# Patient Record
Sex: Female | Born: 1965
Health system: Southern US, Community
[De-identification: ages and names within clinical notes are randomized; demographics above are authoritative.]

## PROBLEM LIST (undated history)

## (undated) DIAGNOSIS — E559 Vitamin D deficiency, unspecified: Secondary | ICD-10-CM

## (undated) DIAGNOSIS — F32A Depression, unspecified: Secondary | ICD-10-CM

## (undated) DIAGNOSIS — F419 Anxiety disorder, unspecified: Secondary | ICD-10-CM

## (undated) DIAGNOSIS — D259 Leiomyoma of uterus, unspecified: Secondary | ICD-10-CM

## (undated) DIAGNOSIS — F329 Major depressive disorder, single episode, unspecified: Secondary | ICD-10-CM

## (undated) HISTORY — PX: UTERINE FIBROID SURGERY: SHX826

## (undated) HISTORY — PX: ORTHOPEDIC SURGERY: SHX850

## (undated) HISTORY — DX: Major depressive disorder, single episode, unspecified: F32.9

## (undated) HISTORY — DX: Vitamin D deficiency, unspecified: E55.9

## (undated) HISTORY — DX: Leiomyoma of uterus, unspecified: D25.9

## (undated) HISTORY — DX: Depression, unspecified: F32.A

## (undated) HISTORY — DX: Anxiety disorder, unspecified: F41.9

---

## 2001-02-11 ENCOUNTER — Other Ambulatory Visit: Admission: RE | Admit: 2001-02-11 | Discharge: 2001-02-11 | Payer: Self-pay | Admitting: Family Medicine

## 2003-10-31 ENCOUNTER — Ambulatory Visit (HOSPITAL_COMMUNITY): Admission: RE | Admit: 2003-10-31 | Discharge: 2003-11-01 | Payer: Self-pay | Admitting: Oral & Maxillofacial Surgery

## 2004-10-17 ENCOUNTER — Ambulatory Visit: Payer: Self-pay

## 2006-01-21 ENCOUNTER — Ambulatory Visit: Payer: Self-pay

## 2006-03-03 ENCOUNTER — Ambulatory Visit: Payer: Self-pay

## 2006-05-13 ENCOUNTER — Ambulatory Visit: Payer: Self-pay | Admitting: Obstetrics & Gynecology

## 2007-04-20 ENCOUNTER — Ambulatory Visit: Payer: Self-pay | Admitting: Family Medicine

## 2008-04-24 ENCOUNTER — Ambulatory Visit: Payer: Self-pay | Admitting: Family Medicine

## 2008-06-01 HISTORY — PX: BUNIONECTOMY: SHX129

## 2009-06-03 ENCOUNTER — Ambulatory Visit: Payer: Self-pay | Admitting: Obstetrics and Gynecology

## 2009-06-06 ENCOUNTER — Ambulatory Visit: Payer: Self-pay | Admitting: Obstetrics and Gynecology

## 2009-06-17 ENCOUNTER — Ambulatory Visit: Payer: Self-pay

## 2010-06-19 ENCOUNTER — Ambulatory Visit: Payer: Self-pay

## 2011-09-16 ENCOUNTER — Ambulatory Visit: Payer: Self-pay | Admitting: Family Medicine

## 2012-04-22 ENCOUNTER — Ambulatory Visit: Payer: Self-pay | Admitting: Family Medicine

## 2012-05-20 ENCOUNTER — Ambulatory Visit: Payer: Self-pay | Admitting: Family Medicine

## 2013-04-04 ENCOUNTER — Ambulatory Visit: Payer: Self-pay | Admitting: Family Medicine

## 2013-04-11 ENCOUNTER — Ambulatory Visit: Payer: Self-pay | Admitting: Family Medicine

## 2013-12-21 IMAGING — CR CERVICAL SPINE - COMPLETE 4+ VIEW
1 series · 7 of 7 positions shown · non-contrast
Comparison: None.

CLINICAL DATA: Neck pain.

EXAM:
CERVICAL SPINE  4+ VIEWS

[Series 1: lat · 0.17mm/px · 7 of 7 slices shown]
[im 1/7]
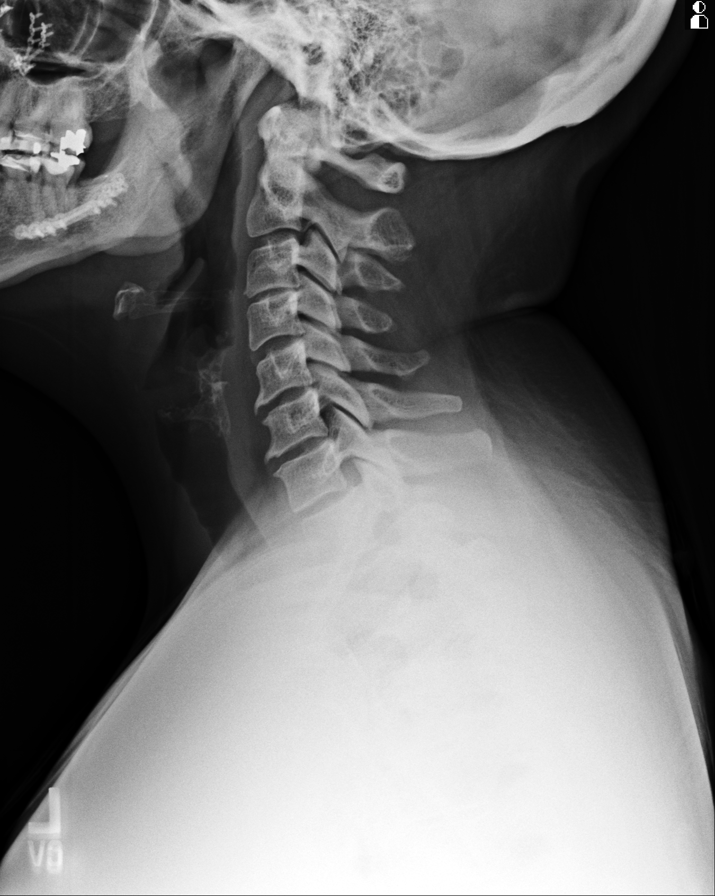
[im 2/7]
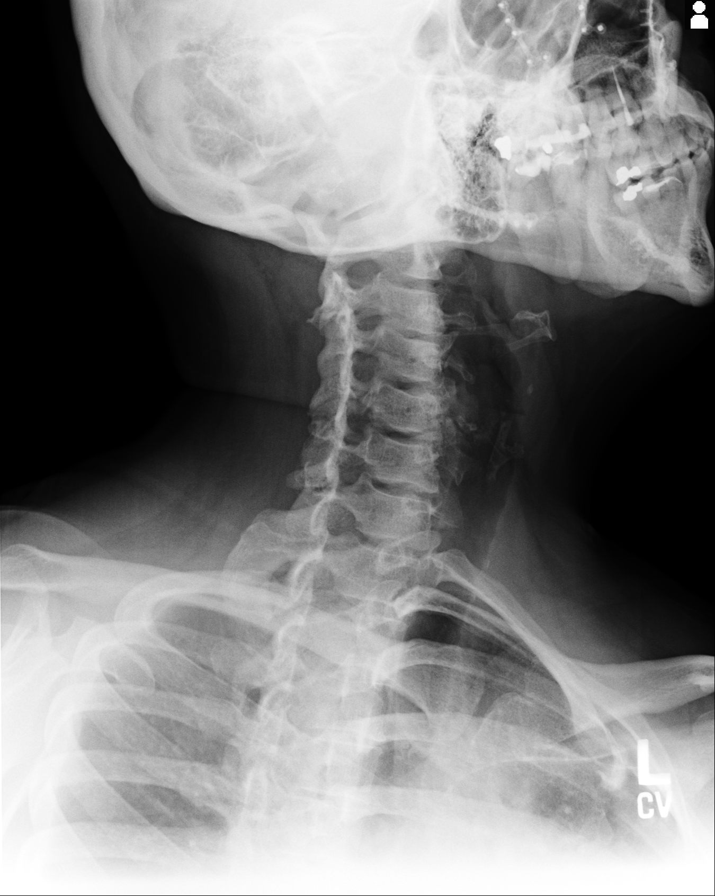
[im 3/7]
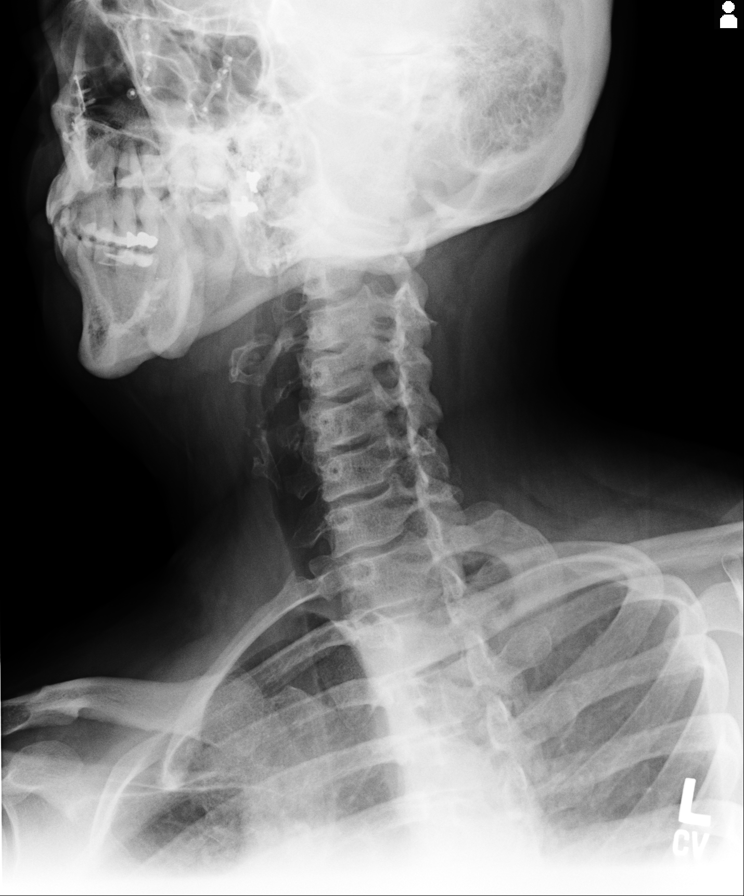
[im 4/7]
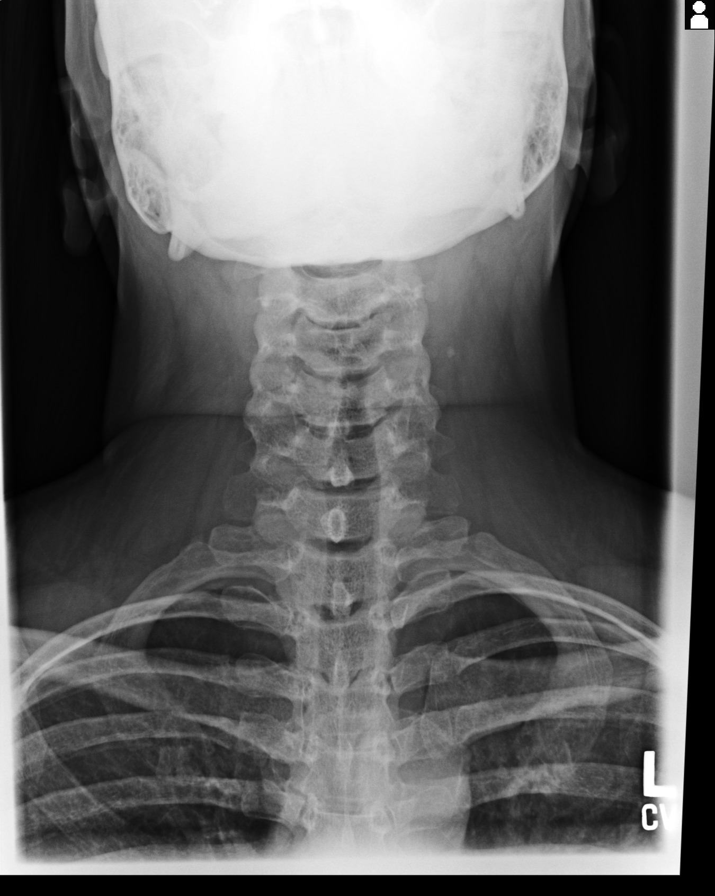
[im 5/7]
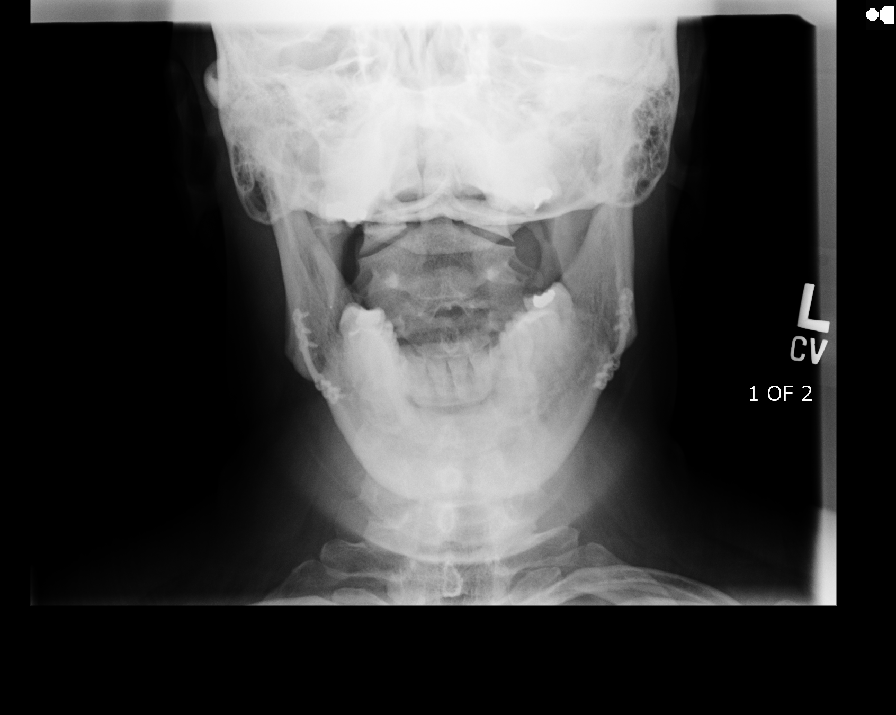
[im 6/7]
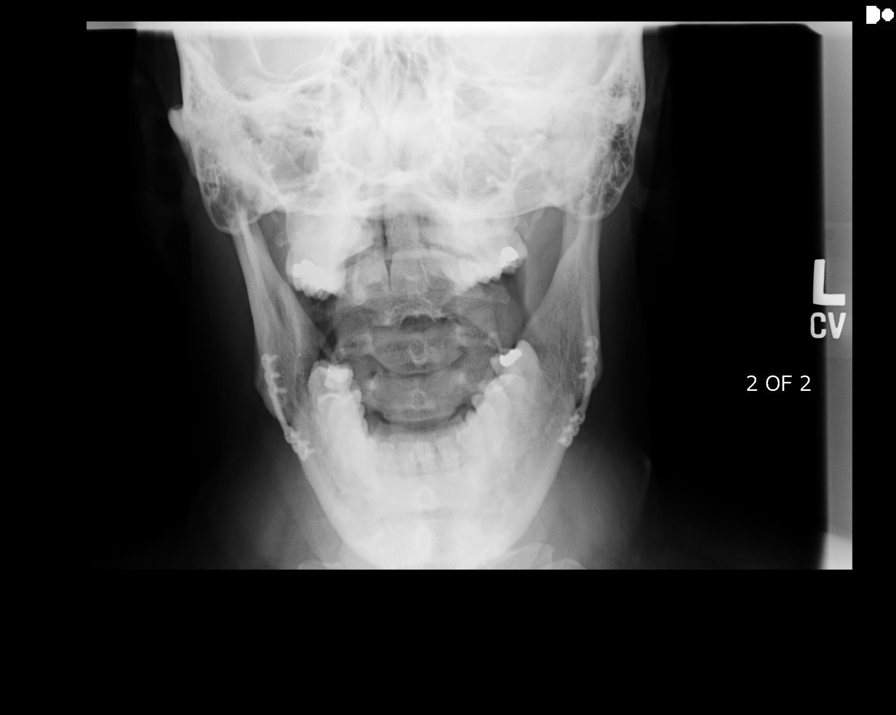
[im 7/7]
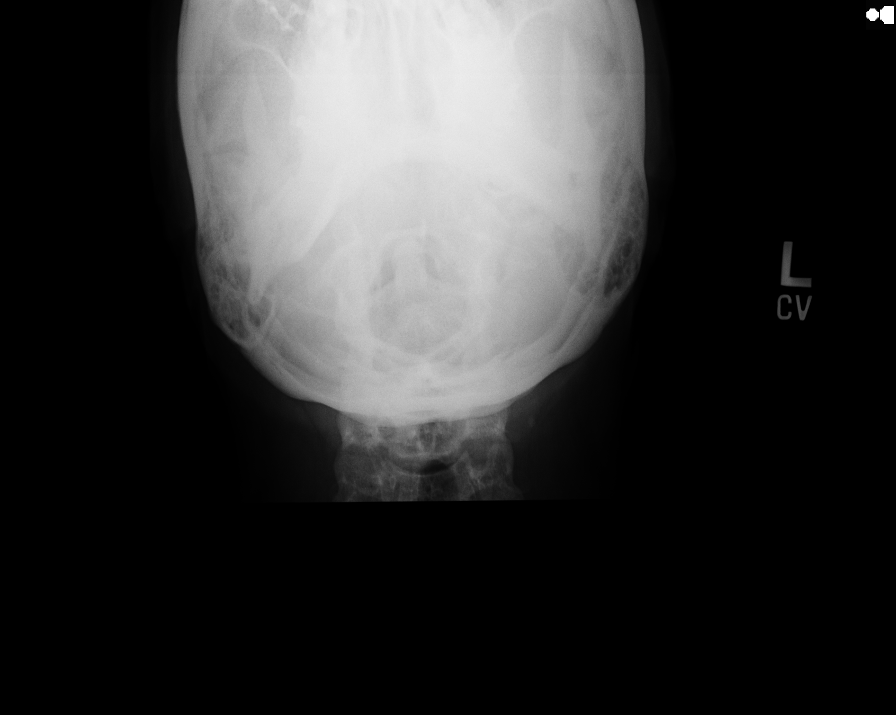

[7 of 7 positions shown; findings below may reference images not displayed]

FINDINGS: Moderate multilevel cervical spondylosis is present. C3-C4, C5-C6
and C6-C7 disc space narrowing is present with marginal osteophytes.
Prevertebral soft tissues are normal. Cervical spinal alignment is
anatomic. The craniocervical alignment appears normal. Mild C5-C6
bilateral uncovertebral spurring. Mild left C6-C7 scars that faint
carotid atherosclerosis calcifications are present. The odontoid
appears intact. Mandibular fixation hardware incidentally noted.
IMPRESSION: Moderate cervical spondylosis detailed above. No acute abnormality.

## 2015-05-14 LAB — VITAMIN D 25 HYDROXY (VIT D DEFICIENCY, FRACTURES): VIT D 25 HYDROXY: 29.8

## 2015-05-15 LAB — THYROXINE (T4) FREE, DIRECT
Thyroxine (T4): 1.26
Vit D, 1,25-Dihydroxy: 43.3

## 2015-08-26 ENCOUNTER — Ambulatory Visit (INDEPENDENT_AMBULATORY_CARE_PROVIDER_SITE_OTHER): Payer: BLUE CROSS/BLUE SHIELD | Admitting: Physician Assistant

## 2015-08-26 ENCOUNTER — Encounter: Payer: Self-pay | Admitting: Physician Assistant

## 2015-08-26 VITALS — BP 118/70 | HR 90 | Ht 68.0 in | Wt 164.0 lb

## 2015-08-26 DIAGNOSIS — Z131 Encounter for screening for diabetes mellitus: Secondary | ICD-10-CM

## 2015-08-26 DIAGNOSIS — J358 Other chronic diseases of tonsils and adenoids: Secondary | ICD-10-CM

## 2015-08-26 DIAGNOSIS — Z8619 Personal history of other infectious and parasitic diseases: Secondary | ICD-10-CM

## 2015-08-26 DIAGNOSIS — Z13220 Encounter for screening for lipoid disorders: Secondary | ICD-10-CM | POA: Diagnosis not present

## 2015-08-26 DIAGNOSIS — E559 Vitamin D deficiency, unspecified: Secondary | ICD-10-CM

## 2015-08-26 DIAGNOSIS — Z1231 Encounter for screening mammogram for malignant neoplasm of breast: Secondary | ICD-10-CM

## 2015-08-26 DIAGNOSIS — R635 Abnormal weight gain: Secondary | ICD-10-CM

## 2015-08-26 LAB — COMPLETE METABOLIC PANEL WITH GFR
ALBUMIN: 4.2 g/dL (ref 3.6–5.1)
ALK PHOS: 43 U/L (ref 33–115)
ALT: 12 U/L (ref 6–29)
AST: 16 U/L (ref 10–35)
BILIRUBIN TOTAL: 1 mg/dL (ref 0.2–1.2)
BUN: 14 mg/dL (ref 7–25)
CALCIUM: 9.3 mg/dL (ref 8.6–10.2)
CO2: 26 mmol/L (ref 20–31)
Chloride: 104 mmol/L (ref 98–110)
Creat: 0.83 mg/dL (ref 0.50–1.10)
GFR, Est Non African American: 84 mL/min (ref >=60)
GLUCOSE: 76 mg/dL (ref 65–99)
POTASSIUM: 4 mmol/L (ref 3.5–5.3)
Sodium: 140 mmol/L (ref 135–146)
Total Protein: 6.8 g/dL (ref 6.1–8.1)

## 2015-08-26 LAB — LIPID PANEL
CHOL/HDL RATIO: 3 ratio (ref <=5.0)
CHOLESTEROL: 216 mg/dL — AB (ref 125–200)
HDL: 72 mg/dL (ref >=46)
LDL Cholesterol: 125 mg/dL (ref <130)
TRIGLYCERIDES: 93 mg/dL (ref <150)
VLDL: 19 mg/dL (ref <30)

## 2015-08-26 MED ORDER — PHENTERMINE HCL 37.5 MG PO TABS: 37.5000 mg | ORAL_TABLET | Freq: Every day | ORAL | Status: DC

## 2015-08-26 NOTE — Progress Notes (Signed)
   Subjective:    Patient ID: Jill Webb, female    DOB: 02-02-1966, 50 y.o.   MRN: [id number]  HPI Pt is a 50 yo female who presents to the clinic to establish care.   .. Active Ambulatory Problems    Diagnosis Date Noted  . History of Helicobacter pylori infection 08/26/2015  . Vitamin D insufficiency 08/26/2015   Resolved Ambulatory Problems    Diagnosis Date Noted  . No Resolved Ambulatory Problems   No Additional Past Medical History   .[name] Family History  Problem Relation Age of Onset  . Hypothyroidism Mother   . Cancer Sister     breast   .. Social History   Social History  . Marital Status: Married    Spouse Name: N/A  . Number of Children: N/A  . Years of Education: N/A   Occupational History  . Not on file.   Social History Main Topics  . Smoking status: Never Smoker   . Smokeless tobacco: Not on file  . Alcohol Use: 0.0 oz/week    0 Standard drinks or equivalent per week  . Drug Use: No  . Sexual Activity: Not Currently   Other Topics Concern  . Not on file   Social History Narrative  . No narrative on file   Pt brings in last bloodwork with vitamin D, Thyroid, b12. Vitamin  D a little low at 29. She started 5000 units since then.   She is actively trying to lose weight and has reached a stall in weight loss. Goal is 15-20lbs. She is walking 4-5 times a week. She is not counting calories. She has never tried any medications.   She does mention some growths on tonsils. They have been present on both sides and come up, last for a while and then seem to go away. She has one on her right tonsils since January. Seems to be getting a little smaller. Does sometime create some tickle sensation in throat. No pain.    Review of Systems  All other systems reviewed and are negative.      Objective:   Physical Exam  Constitutional: She is oriented to person, place, and time. She appears well-developed and well-nourished.  HENT:  Head:  Normocephalic and atraumatic.  Mouth/Throat:    Neck: Normal range of motion. Neck supple. No thyromegaly present.  Cardiovascular: Normal rate, regular rhythm and normal heart sounds.   Pulmonary/Chest: Effort normal and breath sounds normal.  Lymphadenopathy:    She has no cervical adenopathy.  Neurological: She is alert and oriented to person, place, and time.  Psychiatric: She has a normal mood and affect. Her behavior is normal.          Assessment & Plan:  Vitamin d insuffiency- almost at goal in December. Start vitamin d OTC 5000 units. Continue this. Discussed to take with protein to help absorb better. Also spring and summer will absorb more D.   Abnormal weight gain- will start phentermine to give her a boost with weight loss. Goal is 15-20lbs. She is walking 4-5 times a week. Discussed side effects. Follow up weight check in 1 month.   Tonsil abnormality- discussed with patient appears normal and the fact that comes and goes appears to be a normal variant. Discussed ENT referral if becomes bothersome. Consider salt water gargles.   Needs CPE with PAP. Ordered mammogram. Fasting labs ordered.  Will scan in labs done in December.

## 2015-08-26 NOTE — Patient Instructions (Signed)
[email or web address]

## 2015-08-30 ENCOUNTER — Ambulatory Visit (INDEPENDENT_AMBULATORY_CARE_PROVIDER_SITE_OTHER): Payer: BLUE CROSS/BLUE SHIELD

## 2015-08-30 DIAGNOSIS — Z1231 Encounter for screening mammogram for malignant neoplasm of breast: Secondary | ICD-10-CM | POA: Diagnosis not present

## 2015-09-04 ENCOUNTER — Encounter: Payer: Self-pay | Admitting: Physician Assistant

## 2015-09-06 ENCOUNTER — Encounter: Payer: BLUE CROSS/BLUE SHIELD | Admitting: Physician Assistant

## 2015-09-06 ENCOUNTER — Ambulatory Visit: Payer: BLUE CROSS/BLUE SHIELD

## 2015-09-10 ENCOUNTER — Other Ambulatory Visit (HOSPITAL_COMMUNITY)
Admission: RE | Admit: 2015-09-10 | Discharge: 2015-09-10 | Disposition: A | Discharge status: Home / Self Care | Payer: BLUE CROSS/BLUE SHIELD | Source: Ambulatory Visit | Attending: Physician Assistant | Admitting: Physician Assistant

## 2015-09-10 ENCOUNTER — Ambulatory Visit (INDEPENDENT_AMBULATORY_CARE_PROVIDER_SITE_OTHER): Payer: BLUE CROSS/BLUE SHIELD | Admitting: Physician Assistant

## 2015-09-10 ENCOUNTER — Encounter: Payer: Self-pay | Admitting: Physician Assistant

## 2015-09-10 VITALS — BP 102/65 | HR 98 | Ht 68.0 in | Wt 166.0 lb

## 2015-09-10 DIAGNOSIS — R21 Rash and other nonspecific skin eruption: Secondary | ICD-10-CM

## 2015-09-10 DIAGNOSIS — Z0000 Encounter for general adult medical examination without abnormal findings: Secondary | ICD-10-CM | POA: Diagnosis not present

## 2015-09-10 DIAGNOSIS — Z01419 Encounter for gynecological examination (general) (routine) without abnormal findings: Secondary | ICD-10-CM | POA: Diagnosis not present

## 2015-09-10 DIAGNOSIS — Z1151 Encounter for screening for human papillomavirus (HPV): Secondary | ICD-10-CM | POA: Insufficient documentation

## 2015-09-10 MED ORDER — TRIAMCINOLONE ACETONIDE 0.1 % EX CREA: 1.0000 "application " | TOPICAL_CREAM | Freq: Two times a day (BID) | CUTANEOUS | Status: DC

## 2015-09-10 NOTE — Patient Instructions (Signed)

## 2015-09-10 NOTE — Progress Notes (Signed)
Subjective:    Patient ID: Jill Webb, female    DOB: [birth year], 50 y.o.   MRN: [id number]  HPI    Review of Systems     Objective:   Physical Exam        Assessment & Plan:   Subjective:     Jill Webb is a 50 y.o. female and is here for a comprehensive physical exam. The patient reports problems - she reports some itchy bumps on abdomen. seem to be drying up. denies any changes in detergent or medications. started before phentermine. .  Social History   Social History  . Marital Status: Married    Spouse Name: N/A  . Number of Children: N/A  . Years of Education: N/A   Occupational History  . Not on file.   Social History Main Topics  . Smoking status: Never Smoker   . Smokeless tobacco: Not on file  . Alcohol Use: 0.0 oz/week    0 Standard drinks or equivalent per week  . Drug Use: No  . Sexual Activity: Not Currently   Other Topics Concern  . Not on file   Social History Narrative   Health Maintenance  Topic Date Due  . PAP SMEAR  05/01/1989  . HIV Screening  09/09/2016 (Originally 05/02/1983)  . INFLUENZA VACCINE  12/31/2015  . TETANUS/TDAP  06/01/2020    The following portions of the patient's history were reviewed and updated as appropriate: allergies, current medications, past family history, past medical history, past social history, past surgical history and problem list.  Review of Systems Pertinent items noted in HPI and remainder of comprehensive ROS otherwise negative.   Objective:    BP 102/65 mmHg  Pulse 98  Ht 5\' 8"  (1.727 m)  Wt 166 lb (75.297 kg)  BMI 25.25 kg/m2  LMP 09/05/2015 General appearance: alert, cooperative and appears stated age Head: Normocephalic, without obvious abnormality, atraumatic Eyes: conjunctivae/corneas clear. PERRL, EOM's intact. Fundi benign. Ears: normal TM's and external ear canals both ears Nose: Nares normal. Septum midline. Mucosa normal. No drainage or sinus tenderness. Throat:  lips, mucosa, and tongue normal; teeth and gums normal Neck: no adenopathy, no carotid bruit, no JVD, supple, symmetrical, trachea midline and thyroid not enlarged, symmetric, no tenderness/mass/nodules Back: symmetric, no curvature. ROM normal. No CVA tenderness. Lungs: clear to auscultation bilaterally Heart: regular rate and rhythm, S1, S2 normal, no murmur, click, rub or gallop Abdomen: soft, non-tender; bowel sounds normal; no masses,  no organomegaly Pelvic: external genitalia normal, no adnexal masses or tenderness, no cervical motion tenderness, uterus normal size, shape, and consistency, vagina normal without discharge and some 10-12 oclock ulceration and friable mucosa. Extremities: extremities normal, atraumatic, no cyanosis or edema Pulses: 2+ and symmetric Skin: Skin color, texture, turgor normal. No rashes or lesions or a few scattered macular hyperpigmented spots on abdomen. no scales or vesciles.  Lymph nodes: Cervical, supraclavicular, and axillary nodes normal. Neurologic: Reflexes: 2+ and symmetric patallar reflexes absent    Assessment:    Healthy female exam.     Plan:    CPE- pt declined STD testing. Mammogram normal and up to date. Pap done today. Tdap done within 5 years per pt report. Labs looked great. Encouraged pt to start vitamin D 800 units and calcium 1500mg .   Abnormal weight gain- gained a few lbs. Continue on phentermine and diet and exercise. Follow up in 1 month for weight check.   Rash- unclear etiology. Seems to be allergic or possible flea  origin with 3 dogs. Triamcinolone cream given to use as needed for bug bits and itching. Call if worsening or not improving.  See After Visit Summary for Counseling Recommendations

## 2015-09-11 LAB — CYTOLOGY - PAP

## 2016-03-19 DIAGNOSIS — F4322 Adjustment disorder with anxiety: Secondary | ICD-10-CM | POA: Diagnosis not present

## 2016-03-23 DIAGNOSIS — F4322 Adjustment disorder with anxiety: Secondary | ICD-10-CM | POA: Diagnosis not present

## 2016-03-30 DIAGNOSIS — F4322 Adjustment disorder with anxiety: Secondary | ICD-10-CM | POA: Diagnosis not present

## 2016-04-06 DIAGNOSIS — F4322 Adjustment disorder with anxiety: Secondary | ICD-10-CM | POA: Diagnosis not present

## 2016-04-22 DIAGNOSIS — F4322 Adjustment disorder with anxiety: Secondary | ICD-10-CM | POA: Diagnosis not present

## 2016-05-04 DIAGNOSIS — F4322 Adjustment disorder with anxiety: Secondary | ICD-10-CM | POA: Diagnosis not present

## 2016-05-08 ENCOUNTER — Ambulatory Visit (INDEPENDENT_AMBULATORY_CARE_PROVIDER_SITE_OTHER): Payer: BLUE CROSS/BLUE SHIELD | Admitting: Physician Assistant

## 2016-05-08 ENCOUNTER — Encounter: Payer: Self-pay | Admitting: Physician Assistant

## 2016-05-08 VITALS — BP 109/75 | HR 79 | Ht 68.0 in | Wt 180.0 lb

## 2016-05-08 DIAGNOSIS — L659 Nonscarring hair loss, unspecified: Secondary | ICD-10-CM | POA: Diagnosis not present

## 2016-05-08 DIAGNOSIS — Z23 Encounter for immunization: Secondary | ICD-10-CM

## 2016-05-08 DIAGNOSIS — R5383 Other fatigue: Secondary | ICD-10-CM | POA: Diagnosis not present

## 2016-05-08 DIAGNOSIS — F4323 Adjustment disorder with mixed anxiety and depressed mood: Secondary | ICD-10-CM | POA: Diagnosis not present

## 2016-05-08 LAB — CBC WITH DIFFERENTIAL/PLATELET
Basophils Absolute: 63 cells/uL (ref 0–200)
Basophils Relative: 1 %
EOS PCT: 1 %
Eosinophils Absolute: 63 cells/uL (ref 15–500)
HCT: 43.4 % (ref 35.0–45.0)
Hemoglobin: 14 g/dL (ref 11.7–15.5)
LYMPHS PCT: 29 %
Lymphs Abs: 1827 cells/uL (ref 850–3900)
MCH: 26.6 pg — ABNORMAL LOW (ref 27.0–33.0)
MCHC: 32.3 g/dL (ref 32.0–36.0)
MCV: 82.4 fL (ref 80.0–100.0)
MPV: 10.6 fL (ref 7.5–12.5)
Monocytes Absolute: 378 cells/uL (ref 200–950)
Monocytes Relative: 6 %
NEUTROS PCT: 63 %
Neutro Abs: 3969 cells/uL (ref 1500–7800)
PLATELETS: 257 10*3/uL (ref 140–400)
RBC: 5.27 MIL/uL — AB (ref 3.80–5.10)
RDW: 15.4 % — AB (ref 11.0–15.0)
WBC: 6.3 10*3/uL (ref 3.8–10.8)

## 2016-05-08 LAB — COMPLETE METABOLIC PANEL WITH GFR
ALT: 9 U/L (ref 6–29)
AST: 13 U/L (ref 10–35)
Albumin: 4.7 g/dL (ref 3.6–5.1)
Alkaline Phosphatase: 45 U/L (ref 33–115)
BUN: 16 mg/dL (ref 7–25)
CHLORIDE: 105 mmol/L (ref 98–110)
CO2: 22 mmol/L (ref 20–31)
Calcium: 9.8 mg/dL (ref 8.6–10.2)
Creat: 0.96 mg/dL (ref 0.50–1.10)
GFR, Est African American: 81 mL/min (ref >=60)
GFR, Est Non African American: 70 mL/min (ref >=60)
GLUCOSE: 86 mg/dL (ref 65–99)
POTASSIUM: 4.4 mmol/L (ref 3.5–5.3)
SODIUM: 143 mmol/L (ref 135–146)
Total Bilirubin: 1.1 mg/dL (ref 0.2–1.2)
Total Protein: 7.6 g/dL (ref 6.1–8.1)

## 2016-05-08 LAB — IRON AND TIBC
%SAT: 32 % (ref 11–50)
IRON: 113 ug/dL (ref 40–190)
TIBC: 348 ug/dL (ref 250–450)
UIBC: 235 ug/dL (ref 125–400)

## 2016-05-08 LAB — FERRITIN: Ferritin: 68 ng/mL (ref 10–232)

## 2016-05-08 LAB — TSH: TSH: 0.87 mIU/L

## 2016-05-08 MED ORDER — ESCITALOPRAM OXALATE 5 MG PO TABS: 5.0000 mg | ORAL_TABLET | Freq: Every day | ORAL | 1 refills | Status: DC

## 2016-05-08 MED ORDER — CLOBETASOL PROPIONATE 0.05 % EX OINT: 1.0000 "application " | TOPICAL_OINTMENT | Freq: Two times a day (BID) | CUTANEOUS | 0 refills | Status: DC

## 2016-05-08 NOTE — Progress Notes (Addendum)
Subjective:    Patient ID: Jill Webb, female    DOB: May 22, 1966, 50 y.o.   MRN: [id number]  HPI Patient is a 50 yo female coming to the clinic today for hair loss started in October of this year. She did not think much of it until her hair dresser noticed a patch on the top right side of scalp it when she had an appointment with her a month ago. Patient is also reporting feeling depressed and anxious over the past few months. Patient reports that she has been diagnosed with seasonal depression in the past; however, since October she has been feeling more depressed. Patient has not taken an antidepressant in years due to patient trying to control symptoms on her own. Patient reports having a difficult time staying asleep but it has gotten better. She reports that one to two nights a week she wakes up during the night. She feels fatigued and has low energy. She also reports having a poor appetite. Some days she only eats breakfast but nothing else. Patient denies trouble concentrating. Patient denies suicidal or homicidal ideation.   Patient also reports cramping in hands and legs past two weeks. She denies numbness or tingling.   Patient also voiced that hemochromatosis runs in her family. She would like to be screened for this condition.    No past medical history on file.  Outpatient Encounter Prescriptions as of 05/08/2016  Medication Sig  . AMBULATORY NON FORMULARY MEDICATION Take 350 mg by mouth daily. Mega Red  . Cholecalciferol (VITAMIN D3) 5000 units TABS Take 5,000 Units by mouth daily.  . Prenatal Vit-Fe Fumarate-FA (PRENATAL ONE DAILY PO) Take by mouth.  [name] VITAMIN K PO Take 150 mcg by mouth daily.  . clobetasol ointment (TEMOVATE) [id number] % Apply 1 application topically 2 (two) times daily.  [name] escitalopram (LEXAPRO) 5 MG tablet Take 1 tablet (5 mg total) by mouth daily.  . [DISCONTINUED] phentermine (ADIPEX-P) 37.5 MG tablet Take 1 tablet (37.5 mg total) by mouth daily before  breakfast.  . [DISCONTINUED] triamcinolone cream (KENALOG) 0.1 % Apply 1 application topically 2 (two) times daily.   No facility-administered encounter medications on file as of 05/08/2016.    Family History  Problem Relation Age of Onset  . Hypothyroidism Mother   . Cancer Sister     breast   Social History   Social History  . Marital status: Married    Spouse name: N/A  . Number of children: N/A  . Years of education: N/A   Occupational History  . Not on file.   Social History Main Topics  . Smoking status: Never Smoker  . Smokeless tobacco: Not on file  . Alcohol use 0.0 oz/week  . Drug use: No  . Sexual activity: Not Currently   Other Topics Concern  . Not on file   Social History Narrative  . No narrative on file    Allergies  Allergen Reactions  . Codeine Rash   Review of Systems  Musculoskeletal: Positive for myalgias.  Neurological: Negative for weakness and numbness.  Psychiatric/Behavioral: Positive for dysphoric mood and sleep disturbance. Negative for agitation, confusion, decreased concentration and suicidal ideas. The patient is nervous/anxious.        Objective:   Physical Exam  Constitutional: She appears well-developed and well-nourished. No distress.  HENT:  Two 3cm by 3cm and 3cm by 2cm patches of hair loss on the top right side of scalp. One 2cm by 1 cm patch of hair loss  on top left of scalp. No erythema identified, smooth without new hairs forming.   Neck: Thyromegaly (Slight thyromegaly. Symmetric with no nodules palpated ) present.  Cardiovascular: Normal rate, regular rhythm and normal heart sounds.  Exam reveals no gallop and no friction rub.   No murmur heard. Pulmonary/Chest: Effort normal and breath sounds normal. No respiratory distress. She has no wheezes. She has no rales.  Psychiatric: She has a normal mood and affect. Her behavior is normal.     Depression screen PHQ 2/9 05/08/2016  Decreased Interest 2  Down, Depressed,  Hopeless 3  PHQ - 2 Score 5  Altered sleeping 3  Tired, decreased energy 3  Change in appetite 3  Feeling bad or failure about yourself  2  Trouble concentrating 0  Moving slowly or fidgety/restless 3  Suicidal thoughts 0  PHQ-9 Score 19    GAD 7 : Generalized Anxiety Score 05/08/2016  Nervous, Anxious, on Edge 3  Control/stop worrying 2  Worry too much - different things 0  Trouble relaxing 1  Restless 2  Easily annoyed or irritable 2  Afraid - awful might happen 0  Total GAD 7 Score 10  Anxiety Difficulty Not difficult at all      Assessment & Plan:  Jill Webb was seen today for alopecia.  Diagnoses and all orders for this visit:  1. Patchy loss of hair -TSH due to fatigue and hair loss. Her mother has a history of hypothyroidism.        -Thyroid antibodies due to patient experiencing thyroid dysfunction symptoms such as                fatigue and hair loss, slight thyromegaly and family history of thyroid diease.  - Antinuclear Antib (ANA) to screen for autoimmune diseases such as lupus, rheumatoid arthritis, scleroderma and mixed connective tissue disease -COMPLETE METABOLIC PANEL WITH GFR to monitor electrolytes, renal and liver function.  -Start clobetasol ointment (TEMOVATE) 0.05 %; Apply 1 application topically 2 (two) times daily to areas of hair loss.   2. Adjustment disorder with mixed anxiety and depressed mood - Escitalopram (LEXAPRO) 5 MG tablet; Take 1 tablet (5 mg total) by mouth daily for situational depression due to being less than six month duration.  -Follow-up in one month to re-evaluate.   3. Fatigue, unspecified type -TSH due to fatigue and hair loss. Her mother has a history of hypothyroidism.        -Thyroid antibodies due to patient experiencing thyroid dysfunction symptoms such as                fatigue and hair loss, slight thyromegaly and family history of thyroid diease.  - Antinuclear Antib (ANA) to screen for autoimmune diseases such as lupus,  rheumatoid arthritis, scleroderma and mixed connective tissue disease -COMPLETE METABOLIC PANEL WITH GFR to monitor electrolytes, renal and liver function.  - CBC w/Diff/Platelet due to patient being fatigued. Patient also has a family history of hemochromatosis. -Ferritin to screen for iron deficiency anemia        -IBC panel to check of anemia and transferrin is included in panel which will screen for               hemochromatosis. High transferrin saturation is the earliest evidence of                         Hemochromatosis.         - Will call patient with  results of labs. Follow-up in one month to re-evaluate hair loss, depression/anxiety and fatigue.

## 2016-05-08 NOTE — Patient Instructions (Signed)
Alopecia Areata Alopecia areata is a type of hair loss. If you have this condition, you may lose hair on your scalp in patches. In some cases, you may lose all the hair on your scalp (alopecia totalis) or all the hair from your face and body (alopecia universalis).  Alopecia areata is an autoimmune disease. This means your body's defense system (immune system) mistakes normal parts of your body for germs or other things that can make you sick. When you have alopecia areata, your immune system attacks your hair follicles.  Alopecia areata often starts during childhood but can occur at any age. Alopecia areata is not a danger to your health but can be stressful.  CAUSES  The cause of alopecia areata is unknown.  RISK FACTORS You may be at higher risk of alopecia areata if you:   Have a family history of alopecia.  Have a family history of another autoimmune disease, including type 1 diabetes and rheumatoid arthritis. SIGNS AND SYMPTOMS Signs of alopecia areata may include:  Loss of scalp hair in small, round patches. These may be about the size of a quarter.  Loss of all hair on your scalp.  Loss of eyebrow hair, facial hair, or the hair inside your nose (nasal hair).  Hair loss over your entire body. DIAGNOSIS  Alopecia areata may be diagnosed by:  Medical history and physical exam.  Taking a sample of hair to check under a microscope.  Taking a small piece of skin (biopsy) to examine under a microscope.  Blood tests to rule out other autoimmune diseases. TREATMENT  There is no cure for alopecia areata, but the disease often goes away over time. You will not lose the ability to regrow hair. Some medicines may help your hair regrow more quickly. These include:  Corticosteroids. These block inflammation caused by your immune system. You may get this medicine as a lotion for your skin or as an injection.  Minoxidil. This is a hair growth medicine you can use in areas of hair  loss.  Anthralin. This is a medicine for a skin inflammation called psoriasis that may also help alopecia.  Diphencyprone. This medicine is applied to your skin and may stimulate hair growth. HOME CARE INSTRUCTIONS  Use sunscreen or cover your head when outdoors.  Take medicines only as directed by your health care provider.  If you have lost your eyebrows, wear sunglasses outside to keep dust out of your eyes.  If you have lost hair inside your nose, wear a kerchief over your face or apply ointment to the inside of your nose. This keeps out dust and other irritants.  Keep all follow-up visits as directed by your health care provider. This is important. SEEK MEDICAL CARE IF:  Your symptoms change.  You have new symptoms.  You have a reaction to your medicines.  You are struggling emotionally. This information is not intended to replace advice given to you by your health care provider. Make sure you discuss any questions you have with your health care provider. Document Released: 12/21/2003 Document Revised: 06/08/2014 Document Reviewed: 08/07/2013 Elsevier Interactive Patient Education  2017 [employer]  

## 2016-05-09 LAB — THYROID ANTIBODIES
THYROID PEROXIDASE ANTIBODY: 1 [IU]/mL (ref <9)
Thyroglobulin Ab: <1 IU/mL (ref <2)

## 2016-05-11 NOTE — Progress Notes (Signed)
Call pt:  Thyroid antibodies negative.  Iron stores look great.  Thyroid in normal range.  ANA pending.

## 2016-05-12 DIAGNOSIS — F4322 Adjustment disorder with anxiety: Secondary | ICD-10-CM | POA: Diagnosis not present

## 2016-05-12 LAB — ANA: ANA: NEGATIVE

## 2016-06-05 ENCOUNTER — Encounter: Payer: Self-pay | Admitting: Physician Assistant

## 2016-06-05 ENCOUNTER — Ambulatory Visit (INDEPENDENT_AMBULATORY_CARE_PROVIDER_SITE_OTHER): Payer: BLUE CROSS/BLUE SHIELD | Admitting: Physician Assistant

## 2016-06-05 VITALS — BP 133/76 | HR 89 | Ht 68.0 in | Wt 182.0 lb

## 2016-06-05 DIAGNOSIS — F4323 Adjustment disorder with mixed anxiety and depressed mood: Secondary | ICD-10-CM

## 2016-06-05 DIAGNOSIS — L659 Nonscarring hair loss, unspecified: Secondary | ICD-10-CM

## 2016-06-05 MED ORDER — ESCITALOPRAM OXALATE 5 MG PO TABS: 5.0000 mg | ORAL_TABLET | Freq: Every day | ORAL | 1 refills | Status: DC

## 2016-06-05 MED ORDER — CLOBETASOL PROPIONATE 0.05 % EX OINT: 1.0000 "application " | TOPICAL_OINTMENT | Freq: Two times a day (BID) | CUTANEOUS | 0 refills | Status: DC

## 2016-06-05 NOTE — Progress Notes (Signed)
   Subjective:    Patient ID: [name], female    DOB: [birth year], 51 y.o.   MRN: [id number]  HPI  Pt is a 51 yo female who presents to the clinic for 4 week follow up adjustment mixed anxiety/depression and patchy hair loss.   She is doing so much better. She has moved and living in a different environment. She is going to counseling. She is taking lexapro without any side effects. She feels like her hair has started to grow back. She has been using topical steroid but lost tube in the move.     Review of Systems  All other systems reviewed and are negative.      Objective:   Physical Exam  Constitutional: She is oriented to person, place, and time. She appears well-developed and well-nourished.  HENT:  Head: Normocephalic and atraumatic.  Tiny hair sprouts can be seen over patches of hair loss.   Cardiovascular: Normal rate, regular rhythm and normal heart sounds.   Pulmonary/Chest: Effort normal and breath sounds normal.  Neurological: She is alert and oriented to person, place, and time.  Psychiatric: She has a normal mood and affect. Her behavior is normal.          Assessment & Plan:  GAD-7 was 1. PHQ-9 was 3.   Marland KitchenNira Conn was seen today for depression and anxiety.  Diagnoses and all orders for this visit:  Adjustment disorder with mixed anxiety and depressed mood -     escitalopram (LEXAPRO) 5 MG tablet; Take 1 tablet (5 mg total) by mouth daily.  Patchy loss of hair -     clobetasol ointment (TEMOVATE) 0.05 %; Apply 1 application topically 2 (two) times daily.   Doing great. Refilled lexapro for 6 months.  Refilled topical steroid. Reassured patient previous labs looked great.

## 2016-08-12 ENCOUNTER — Other Ambulatory Visit: Payer: Self-pay | Admitting: Physician Assistant

## 2016-08-12 DIAGNOSIS — Z1239 Encounter for other screening for malignant neoplasm of breast: Secondary | ICD-10-CM

## 2016-09-04 ENCOUNTER — Ambulatory Visit: Payer: BLUE CROSS/BLUE SHIELD

## 2016-09-11 ENCOUNTER — Encounter: Payer: BLUE CROSS/BLUE SHIELD | Admitting: Physician Assistant

## 2016-09-14 ENCOUNTER — Encounter: Payer: BLUE CROSS/BLUE SHIELD | Admitting: Physician Assistant

## 2016-09-30 ENCOUNTER — Ambulatory Visit (INDEPENDENT_AMBULATORY_CARE_PROVIDER_SITE_OTHER): Payer: BLUE CROSS/BLUE SHIELD

## 2016-09-30 ENCOUNTER — Ambulatory Visit (INDEPENDENT_AMBULATORY_CARE_PROVIDER_SITE_OTHER): Payer: BLUE CROSS/BLUE SHIELD | Admitting: Physician Assistant

## 2016-09-30 ENCOUNTER — Encounter: Payer: Self-pay | Admitting: Physician Assistant

## 2016-09-30 VITALS — BP 123/74 | HR 92 | Ht 68.0 in | Wt 185.0 lb

## 2016-09-30 DIAGNOSIS — J358 Other chronic diseases of tonsils and adenoids: Secondary | ICD-10-CM | POA: Insufficient documentation

## 2016-09-30 DIAGNOSIS — F4323 Adjustment disorder with mixed anxiety and depressed mood: Secondary | ICD-10-CM

## 2016-09-30 DIAGNOSIS — M542 Cervicalgia: Secondary | ICD-10-CM | POA: Diagnosis not present

## 2016-09-30 DIAGNOSIS — L659 Nonscarring hair loss, unspecified: Secondary | ICD-10-CM

## 2016-09-30 DIAGNOSIS — M5412 Radiculopathy, cervical region: Secondary | ICD-10-CM

## 2016-09-30 DIAGNOSIS — Z13220 Encounter for screening for lipoid disorders: Secondary | ICD-10-CM | POA: Diagnosis not present

## 2016-09-30 DIAGNOSIS — M5011 Cervical disc disorder with radiculopathy,  high cervical region: Secondary | ICD-10-CM

## 2016-09-30 DIAGNOSIS — Z0000 Encounter for general adult medical examination without abnormal findings: Secondary | ICD-10-CM

## 2016-09-30 DIAGNOSIS — Z131 Encounter for screening for diabetes mellitus: Secondary | ICD-10-CM | POA: Diagnosis not present

## 2016-09-30 DIAGNOSIS — R220 Localized swelling, mass and lump, head: Secondary | ICD-10-CM | POA: Diagnosis not present

## 2016-09-30 LAB — LIPID PANEL
CHOL/HDL RATIO: 2.9 ratio (ref <5.0)
Cholesterol: 246 mg/dL — ABNORMAL HIGH (ref <200)
HDL: 84 mg/dL (ref >50)
LDL CALC: 144 mg/dL — AB (ref <100)
Triglycerides: 89 mg/dL (ref <150)
VLDL: 18 mg/dL (ref <30)

## 2016-09-30 LAB — T4, FREE: Free T4: 1.1 ng/dL (ref 0.8–1.8)

## 2016-09-30 LAB — BASIC METABOLIC PANEL
BUN: 17 mg/dL (ref 7–25)
CO2: 26 mmol/L (ref 20–31)
CREATININE: 0.96 mg/dL (ref 0.50–1.10)
Calcium: 9.4 mg/dL (ref 8.6–10.2)
Chloride: 103 mmol/L (ref 98–110)
Glucose, Bld: 78 mg/dL (ref 65–99)
Potassium: 4.3 mmol/L (ref 3.5–5.3)
SODIUM: 141 mmol/L (ref 135–146)

## 2016-09-30 LAB — TSH: TSH: 1.7 mIU/L

## 2016-09-30 MED ORDER — ESCITALOPRAM OXALATE 5 MG PO TABS: 5.0000 mg | ORAL_TABLET | Freq: Every day | ORAL | 4 refills | Status: DC

## 2016-09-30 MED ORDER — PREDNISONE 20 MG PO TABS: ORAL_TABLET | ORAL | 0 refills | Status: DC

## 2016-09-30 NOTE — Patient Instructions (Addendum)
Biotin for your hair, skin, nails.  Vitamin d daily.  Will make ENT referral.

## 2016-10-01 LAB — VITAMIN B12: Vitamin B-12: 628 pg/mL (ref 200–1100)

## 2016-10-01 LAB — VITAMIN D 25 HYDROXY (VIT D DEFICIENCY, FRACTURES): Vit D, 25-Hydroxy: 26 ng/mL — ABNORMAL LOW (ref 30–100)

## 2016-10-02 ENCOUNTER — Encounter: Payer: Self-pay | Admitting: Physician Assistant

## 2016-10-02 ENCOUNTER — Ambulatory Visit (INDEPENDENT_AMBULATORY_CARE_PROVIDER_SITE_OTHER): Payer: BLUE CROSS/BLUE SHIELD

## 2016-10-02 DIAGNOSIS — Z1231 Encounter for screening mammogram for malignant neoplasm of breast: Secondary | ICD-10-CM | POA: Diagnosis not present

## 2016-10-02 DIAGNOSIS — M5030 Other cervical disc degeneration, unspecified cervical region: Secondary | ICD-10-CM | POA: Insufficient documentation

## 2016-10-02 DIAGNOSIS — E785 Hyperlipidemia, unspecified: Secondary | ICD-10-CM | POA: Insufficient documentation

## 2016-10-02 DIAGNOSIS — Z1239 Encounter for other screening for malignant neoplasm of breast: Secondary | ICD-10-CM

## 2016-10-02 NOTE — Progress Notes (Signed)
Call pt: normal mammogram. Follow up in one year.

## 2016-10-02 NOTE — Progress Notes (Signed)
Call pt: vitamin d low and lower than last year. Need to be on at least 1000 units daily of d3.  B12, thyroid, kidney,liver, glucose look great.  LDL up from 1 year ago. LDL 144. Overall cardiac risk factors are still low. Need to work on low fat diet/exercise and consider OTC red yeast rice 1200mg  bid.

## 2016-10-02 NOTE — Progress Notes (Signed)
Subjective:     Jill Webb is a 51 y.o. female and is here for a comprehensive physical exam. The patient reports problems - she continues to have some neck and upper back pain. worse when laying down. at night she wakes frequently in pain. better during the day. she often has numbness and tingling going down both arms. ibuprofen does help a lot. takes it more at night. .  Social History   Social History  . Marital status: Married    Spouse name: N/A  . Number of children: N/A  . Years of education: N/A   Occupational History  . Not on file.   Social History Main Topics  . Smoking status: Never Smoker  . Smokeless tobacco: Never Used  . Alcohol use 0.0 oz/week  . Drug use: No  . Sexual activity: Not Currently   Other Topics Concern  . Not on file   Social History Narrative  . No narrative on file   Health Maintenance  Topic Date Due  . HIV Screening  05/02/1983  . MAMMOGRAM  08/29/2016  . INFLUENZA VACCINE  12/30/2016  . PAP SMEAR  09/10/2018  . TETANUS/TDAP  06/01/2020    The following portions of the patient's history were reviewed and updated as appropriate: allergies, current medications, past family history, past medical history, past social history, past surgical history and problem list.  Review of Systems Pertinent items noted in HPI and remainder of comprehensive ROS otherwise negative.   Objective:    BP 123/74   Pulse 92   Ht 5\' 8"  (1.727 m)   Wt 185 lb (83.9 kg)   BMI 28.13 kg/m  General appearance: alert, cooperative and appears stated age Head: Normocephalic, without obvious abnormality, atraumatic Eyes: conjunctivae/corneas clear. PERRL, EOM's intact. Fundi benign. Ears: normal TM's and external ear canals both ears Nose: Nares normal. Septum midline. Mucosa normal. No drainage or sinus tenderness. Throat: lips, mucosa, and tongue normal; teeth and gums normal right tonsil mass that is flesh colored.  Neck: no adenopathy, no carotid bruit,  no JVD, supple, symmetrical, trachea midline and thyroid not enlarged, symmetric, no tenderness/mass/nodules Back: symmetric, no curvature. ROM normal. No CVA tenderness. Lungs: clear to auscultation bilaterally Heart: regular rate and rhythm, S1, S2 normal, no murmur, click, rub or gallop Abdomen: soft, non-tender; bowel sounds normal; no masses,  no organomegaly Pelvic: external genitalia normal, no adnexal masses or tenderness, no cervical motion tenderness, uterus normal size, shape, and consistency and vagina normal without discharge Extremities: extremities normal, atraumatic, no cyanosis or edema cspine tenderness to palpation. Pulses: 2+ and symmetric Skin: Skin color, texture, turgor normal. No rashes or lesions Lymph nodes: Cervical, supraclavicular, and axillary nodes normal. Neurologic: Alert and oriented X 3, normal strength and tone. Normal symmetric reflexes. Normal coordination and gait    Assessment:    Healthy female exam.     Plan:    Marland KitchenMarland KitchenEthelle was seen today for annual exam.  Diagnoses and all orders for this visit:  Routine physical examination -     TSH -     T4, free -     Lipid panel -     Basic metabolic panel -     VITAMIN D 25 Hydroxy (Vit-D Deficiency, Fractures) -     B12  Hair loss -     TSH -     T4, free -     VITAMIN D 25 Hydroxy (Vit-D Deficiency, Fractures) -     B12  Screening for  lipid disorders -     Lipid panel  Screening for diabetes mellitus -     Basic metabolic panel  Cervical radiculopathy -     DG Cervical Spine Complete; Future -     predniSONE (DELTASONE) 20 MG tablet; Take 3 tablets for 3 days, take 2 tablets for 3 days, take 1 tablet for 3 days, take 1/2 tablet for 4 days.  Adjustment disorder with mixed anxiety and depressed mood -     escitalopram (LEXAPRO) 5 MG tablet; Take 1 tablet (5 mg total) by mouth daily.  Tonsillar mass  .[name] Depression screen [facility] 2/9 09/30/2016 06/05/2016 05/08/2016  Decreased Interest 0 0 2   Down, Depressed, Hopeless 0 0 3  PHQ - 2 Score 0 0 5  Altered sleeping - 0 3  Tired, decreased energy - 1 3  Change in appetite - 2 3  Feeling bad or failure about yourself  - 0 2  Trouble concentrating - 0 0  Moving slowly or fidgety/restless - 0 3  Suicidal thoughts - 0 0  PHQ-9 Score - 3 19   Consider biotin for hair loss.   prednisone given for radiculopathy.  Ibuprofen as needed.  Will follow up with treatment plan after xray.   tonsillar mass causes some discomfort. Will send to ENT.   Mammogram scheduled.  Discussed vitamin D 800 units and calcium 1500mg  daily.  Exercise 150 minutes weekly.     See After Visit Summary for Counseling Recommendations

## 2016-10-02 NOTE — Progress Notes (Signed)
Call pt: degenerative disc disc seen and has progressed. Need to get MRI and have you seen by sports medicine doctors or ortho. Are you ok with this?

## 2016-10-22 DIAGNOSIS — J359 Chronic disease of tonsils and adenoids, unspecified: Secondary | ICD-10-CM | POA: Diagnosis not present

## 2016-10-27 ENCOUNTER — Encounter: Payer: Self-pay | Admitting: Sports Medicine

## 2016-10-27 ENCOUNTER — Ambulatory Visit (INDEPENDENT_AMBULATORY_CARE_PROVIDER_SITE_OTHER): Payer: BLUE CROSS/BLUE SHIELD | Admitting: Sports Medicine

## 2016-10-27 DIAGNOSIS — M5030 Other cervical disc degeneration, unspecified cervical region: Secondary | ICD-10-CM | POA: Diagnosis not present

## 2016-10-27 MED ORDER — MELOXICAM 15 MG PO TABS: ORAL_TABLET | ORAL | 3 refills | Status: DC

## 2016-10-27 MED ORDER — GABAPENTIN 300 MG PO CAPS: ORAL_CAPSULE | ORAL | 3 refills | Status: DC

## 2016-10-27 NOTE — Progress Notes (Signed)
   Subjective:    I'm seeing this patient as a consultation for:  [name] PA-C  CC: back pain  HPI: Jill Webb is a 51 y.o. Female with a history of cervical radiculopathy who presents with cervical back pain. She had formal physical therapy for her back pain about a year ago, and her pain more or less resolved until about a month ago. Her back pain is located between the scapulae, and is worse when she wakes up in the morning, but improves after moving around a bit. She has been taking Aleve for the pain, which seems to help. She also complains of intermittent numbness in digits 2-5 of her left hand.  Past medical history:  Negative.  See flowsheet/record as well for more information.  Surgical history: Negative.  See flowsheet/record as well for more information.  Family history: Negative.  See flowsheet/record as well for more information.  Social history: Negative.  See flowsheet/record as well for more information.  Allergies, and medications have been entered into the medical record, reviewed, and no changes needed.   Review of Systems: No headache, visual changes, nausea, vomiting, diarrhea, constipation, dizziness, abdominal pain, skin rash, fevers, chills, night sweats, weight loss, swollen lymph nodes, body aches, joint swelling, muscle aches, chest pain, shortness of breath, mood changes, visual or auditory hallucinations.   Objective:   General: Well Developed, well nourished, and in no acute distress.  Neuro/Psych: Alert and oriented x3, extra-ocular muscles intact, able to move all 4 extremities, sensation grossly intact. Skin: Warm and dry, no rashes noted.  Respiratory: Not using accessory muscles, speaking in full sentences, trachea midline.  Cardiovascular: Pulses palpable, no extremity edema. Abdomen: Does not appear distended. MSK: TTP over cervical spine between the scapulae. Full strength and ROM in shoulder and elbow bilaterally. Biceps, triceps,  brachioradialis 2+ bilaterally. Negative Hoffman's sign.   Impression and Recommendations:   This case required medical decision making of moderate complexity.  Jill Webb is a 51 y.o. Female with a history of cervical DDD who presents with exacerbation for the past month.  Numbness in fingers consistent with C7 and C8 radiculopathy. Previous x-rays showed multiple degenerative changes. Restarting physical therapy. Prescribed gabapentin at bedtime, meloxicam. Follow-up in one month. MRI may be indicated for interventional planning if no better.

## 2016-10-27 NOTE — Assessment & Plan Note (Signed)
With left-sided C7 and C8 radicular symptoms. X-rays showed multiple degenerative changes. Restarting physical therapy, adding gabapentin at bedtime, meloxicam, return in one month, MRI for interventional planning if no better.

## 2016-11-04 ENCOUNTER — Encounter: Payer: Self-pay | Admitting: Physical Therapy

## 2016-11-04 ENCOUNTER — Ambulatory Visit (INDEPENDENT_AMBULATORY_CARE_PROVIDER_SITE_OTHER): Payer: BLUE CROSS/BLUE SHIELD | Admitting: Physical Therapy

## 2016-11-04 DIAGNOSIS — M62830 Muscle spasm of back: Secondary | ICD-10-CM

## 2016-11-04 DIAGNOSIS — M546 Pain in thoracic spine: Secondary | ICD-10-CM

## 2016-11-04 NOTE — Therapy (Signed)
[facility] [address] [place], [place], [address] Phone: [phone]   Fax:  [phone]  Physical Therapy Evaluation  Patient Details  Name: Jill Webb MRN: [id number] Date of Birth: [birth year] Referring Provider: Dr Dianah Field  Encounter Date: 11/04/2016      PT End of Session - 11/04/16 1557    Visit Number 1   Number of Visits 8   Date for PT Re-Evaluation 12/02/16   PT Start Time 2423   PT Stop Time 1641   PT Time Calculation (min) 44 min   Activity Tolerance Patient tolerated treatment well      History reviewed. No pertinent past medical history.  Past Surgical History:  Procedure Laterality Date  . BUNIONECTOMY  2010  . UTERINE FIBROID SURGERY  2007, 2011    There were no vitals filed for this visit.       Subjective Assessment - 11/04/16 1555    Subjective Pt has a d/o cervical radiculopathy, she had therapy about a year ago with good results.  Her neck flared up about a month ago prompting her to return to her MD. She states it is of insidious onset, worse with lying down and better with being up and moving around.  Last time with therapy it was more cervical now it is more thoracic area.    Pertinent History h/o dx of military neck.    How long can you sit comfortably? no problem   How long can you walk comfortably? no problem   Diagnostic tests x-rays cervical DDD,    Patient Stated Goals sleep per her previous level, get off pain meds if able, not have surgery.    Currently in Pain? No/denies            [facility] PT Assessment - 11/04/16 0001      Assessment   Medical Diagnosis Cervical DDD   Referring Provider Dr Dianah Field   Onset Date/Surgical Date 09/04/16   Hand Dominance Right   Next MD Visit 11/28/16   Prior Therapy not for thoracic, only cervical      Precautions   Precautions None     Balance Screen   Has the patient fallen in the past 6 months No     Prior  Function   Level of Independence Independent   Vocation Full time employment   Presenter, broadcasting, desk work   Leisure play with her dogs.      Observation/Other Assessments   Focus on Therapeutic Outcomes (FOTO)  22% limited     Posture/Postural Control   Posture/Postural Control No significant limitations     ROM / Strength   AROM / PROM / Strength AROM;Strength     AROM   AROM Assessment Site Shoulder;Cervical   Right/Left Shoulder --  bilat WNL    Cervical Flexion WNL  has mid back pulling with all cervical motion   Cervical Extension WNL   Cervical - Right Side Bend WNL   Cervical - Left Side Bend WNL   Cervical - Right Rotation WNL   Cervical - Left Rotation WNL     Strength   Strength Assessment Site Shoulder;Elbow   Right/Left Shoulder --  bilat WNL   Right/Left Elbow --  bilat WNL     Palpation   Spinal mobility T9-C3 CPA mobs, Lt UPA C7-T3, Rt CPA T5-9 painful,   all areas hypomobile    Palpation comment palpable tightness in lower thoracic paraspinals Rt upper trap and levator.  Special Tests    Special Tests Cervical   Cervical Tests Spurling's;Dictraction     Spurling's   Findings Negative   Side Left   Comment and Rt     Distraction Test   Findngs Negative   side Left   Comment and Rt             Objective measurements completed on examination: See above findings.          [facility] Adult PT Treatment/Exercise - 11/04/16 0001      Exercises   Exercises Shoulder     Shoulder Exercises: Supine   Horizontal ABduction Strengthening;Both;10 reps;Theraband   Theraband Level (Shoulder Horizontal ABduction) Level 1 (Yellow)   External Rotation Strengthening;Both;10 reps;Theraband   Theraband Level (Shoulder External Rotation) Level 1 (Yellow)   Flexion Strengthening;Both;10 reps;Theraband   Theraband Level (Shoulder Flexion) Level 1 (Yellow)   Other Supine Exercises 10 reps SASH, yellow band, all of these while  on pool noodle                PT Education - 11/04/16 1630    Education provided Yes   Education Details HEP    Person(s) Educated Patient   Methods Explanation;Demonstration;Handout   Comprehension Returned demonstration;Verbalized understanding             PT Long Term Goals - 11/04/16 1554      PT LONG TERM GOAL #1   Title I with advanced HEP (12/02/16)    Time 4   Period Weeks   Status New     PT LONG TERM GOAL #2   Title maintain FOTO with in 2 points of 22% limited ( 12/02/16)    Time 4   Period Weeks   Status New     PT LONG TERM GOAL #3   Title report pain reduction in her thoracic area to allow her to sleep per her previous level ( 12/02/16)    Time 4   Period Weeks   Status New     PT LONG TERM GOAL #4   Title demo increased mobility of her thoracic and cervical spine without pain ( 12/02/16)    Time 4   Period Weeks   Status New                Plan - 11/04/16 1645    Clinical Impression Statement 51 yo female presents for assessment of thoracic/cervical pain.  She experiences it mainly with lying down.  She is currently using pain meds to help manage her symptoms when she is up.  Her motion and strength is good. She does have hypomobility in her thoracic and cervical spine along with palpable tightness in the rt upper trap/levator and bilateral thoracic paraspinals.    Clinical Presentation Stable   Clinical Decision Making Low   Rehab Potential Excellent   PT Frequency 2x / week   PT Duration 4 weeks   PT Treatment/Interventions Moist Heat;Traction;Ultrasound;Therapeutic exercise;Dry needling;Manual techniques;Taping;Neuromuscular re-education;Cryotherapy;Electrical Stimulation;Iontophoresis 4mg /ml Dexamethasone;Patient/family education   PT Next Visit Plan manual work thoracic paraspinals and cervical areas.  Spinal mobility thoracic   Consulted and Agree with Plan of Care Patient      Patient will benefit from skilled therapeutic  intervention in order to improve the following deficits and impairments:  Pain, Impaired UE functional use, Increased muscle spasms  Visit Diagnosis: Muscle spasm of back  Pain in thoracic spine     Problem List Patient Active Problem List   Diagnosis Date Noted  .  Hyperlipidemia 10/02/2016  . DDD (degenerative disc disease), cervical 10/02/2016  . Tonsillar mass 09/30/2016  . Patchy loss of hair 05/08/2016  . Adjustment disorder with mixed anxiety and depressed mood 05/08/2016  . Fatigue 05/08/2016  . History of Helicobacter pylori infection 08/26/2015  . Vitamin D insufficiency 08/26/2015  . Tonsil asymmetry 08/26/2015    Jeral Pinch PT  11/04/2016, 4:54 PM  [facility] [place] [address] [place], [place], [address] Phone: [phone]   Fax:  [phone]  Name: Jill Webb MRN: [id number] Date of Birth: 1966-03-21

## 2016-11-04 NOTE — Patient Instructions (Addendum)
Over Head Pull: Narrow Grip     [place] [id number], do all exercise once a day, lying on a pool noodle, keep core tight.    On back, knees bent, feet flat, band across thighs, elbows straight but relaxed. Pull hands apart (start). Keeping elbows straight, bring arms up and over head, hands toward floor. Keep pull steady on band. Hold momentarily. Return slowly, keeping pull steady, back to start. Repeat _20__ times. Band color ___yellow___   Side Pull: Double Arm   On back, knees bent, feet flat. Arms perpendicular to body, shoulder level, elbows straight but relaxed. Pull arms out to sides, elbows straight. Resistance band comes across collarbones, hands toward floor. Hold momentarily. Slowly return to starting position. Repeat _20__ times. Band color __yellow___. Squeeze shoulder blades around the noodle.   Sash   On back, knees bent, feet flat, left hand on left hip, right hand above left. Pull right arm DIAGONALLY (hip to shoulder) across chest. Bring right arm along head toward floor. Hold momentarily. Slowly return to starting position. Repeat _20__ times. Do with left arm. Band color ___yellow___   Shoulder Rotation: Double Arm   On back, knees bent, feet flat, elbows tucked at sides, bent 90, hands palms up. Pull hands apart and down toward floor, keeping elbows near sides. Hold momentarily. Slowly return to starting position. Repeat _20__ times. Band color __yellow____ . Squeeze shoulder blades around the noodle.    Scapular Retraction (Standing)    With arms at sides, pinch shoulder blades together. Repeat __5-10__ times per set. Do ____ sets per session. Do _multiple___ sessions per day, when at work.  Flexibility: Neck Retraction    Pull head straight back, keeping eyes and jaw level. Repeat __5-10__ times per set. Do ____ sets per session. Do _multiple___ sessions per day when at work.   Copyright  VHI. All rights reserved.

## 2016-11-09 ENCOUNTER — Ambulatory Visit (INDEPENDENT_AMBULATORY_CARE_PROVIDER_SITE_OTHER): Payer: BLUE CROSS/BLUE SHIELD | Admitting: Physical Therapy

## 2016-11-09 DIAGNOSIS — M546 Pain in thoracic spine: Secondary | ICD-10-CM

## 2016-11-09 DIAGNOSIS — M62830 Muscle spasm of back: Secondary | ICD-10-CM | POA: Diagnosis not present

## 2016-11-09 NOTE — Therapy (Signed)
[facility] [place], [place], [address] Phone: [phone]   Fax:  [phone]  Physical Therapy Treatment  Patient Details  Name: Jill Webb MRN: [id number] Date of Birth: 05/06/1966 Referring Provider: Dr. Dianah Field  Encounter Date: 11/09/2016      PT End of Session - 11/09/16 1648    Visit Number 2   Number of Visits 8   Date for PT Re-Evaluation 12/02/16   PT Start Time 1602   PT Stop Time 1658   PT Time Calculation (min) 56 min   Activity Tolerance Patient tolerated treatment well   Behavior During Therapy [facility] for tasks assessed/performed      No past medical history on file.  Past Surgical History:  Procedure Laterality Date  . BUNIONECTOMY  2010  . UTERINE FIBROID SURGERY  2007, 2011    There were no vitals filed for this visit.      Subjective Assessment - 11/09/16 1632    Subjective Pt reports she has had a catch in her Rt lower back since reaching over to pick up dog bowl yesterday.  Exercises from HEP going well; could use more resistance.    Pertinent History h/o dx of military neck.    Patient Stated Goals sleep per her previous level, get off pain meds if able, not have surgery.    Currently in Pain? No/denies            [facility] PT Assessment - 11/09/16 0001      Assessment   Medical Diagnosis Cervical DDD   Referring Provider Dr. Dianah Field   Onset Date/Surgical Date 09/04/16   Hand Dominance Right   Next MD Visit 11/28/16   Prior Therapy not for thoracic, only cervical          [facility] Adult PT Treatment/Exercise - 11/09/16 0001      Self-Care   Self-Care Other Self-Care Comments   Other Self-Care Comments  Pt educated on self massage with ball to tight musculature of back. Pt verbalized understnading and returned demo.      Shoulder Exercises: Supine   Horizontal ABduction Strengthening;Both;10 reps;Theraband   Theraband Level (Shoulder Horizontal  ABduction) Level 1 (Yellow)   External Rotation Strengthening;Both;10 reps;Theraband   Theraband Level (Shoulder External Rotation) Level 2 (Red)   Flexion Strengthening;Both;10 reps;Theraband   Theraband Level (Shoulder Flexion) Level 1 (Yellow)   Other Supine Exercises thoracic ext over perpendicular pool noodle x 5 reps     Shoulder Exercises: Seated   Other Seated Exercises thoracic ext over chair back with head supported, 5 sec hold x 5 reps     Shoulder Exercises: ROM/Strengthening   UBE (Upper Arm Bike) L2: 1.5 min each direction      Shoulder Exercises: Stretch   Table Stretch - Flexion 1 rep;30 seconds   Other Shoulder Stretches 3 position doorway stretch x 2 reps x 30 sec each; tactile cues for form    Other Shoulder Stretches modified downward dog with arms on counter x 30 sec.      Modalities   Modalities Electrical Stimulation;Moist Heat     Moist Heat Therapy   Number Minutes Moist Heat 12 Minutes   Moist Heat Location Cervical  upper thoracic      Electrical Stimulation   Electrical Stimulation Location bilat thoracic paraspinals   Electrical Stimulation Action IFC   Electrical Stimulation Parameters to tolerance    Electrical Stimulation Goals Pain     Manual Therapy   Manual Therapy  Joint mobilization;Soft tissue mobilization  provided by supervising PT, Letta Kocher   Manual therapy comments pt prone   Joint Mobilization Grade II CPA mobs from T1-T8, 2x 30 sec bouts; grade III Rt UPA to articular pilar T3-4.    Soft tissue mobilization STM to Rt lower trap                     PT Long Term Goals - 11/09/16 1634      PT LONG TERM GOAL #1   Title I with advanced HEP (12/02/16)    Time 4   Status On-going     PT LONG TERM GOAL #2   Title maintain FOTO with in 2 points of 22% limited ( 12/02/16)    Time 4   Period Weeks   Status On-going     PT LONG TERM GOAL #3   Title report pain reduction in her thoracic area to allow her to sleep per her  previous level ( 12/02/16)    Time 4   Period Weeks   Status On-going     PT LONG TERM GOAL #4   Title demo increased mobility of her thoracic and cervical spine without pain ( 12/02/16)    Time 4   Period Weeks   Status On-going               Plan - 11/09/16 1635    Clinical Impression Statement Per supervising PT; she is tighter in mid-lower thoracic spine with spinal mobilization. It appears she has a slight Rt rotation in segment T3-4. She had some tenderness with mobs to spine.  She tolerated all exercises well without increase in symptoms.  No goals met yet; only 2nd visit.    Rehab Potential Excellent   PT Frequency 2x / week   PT Duration 4 weeks   PT Treatment/Interventions Moist Heat;Traction;Ultrasound;Therapeutic exercise;Dry needling;Manual techniques;Taping;Neuromuscular re-education;Cryotherapy;Electrical Stimulation;Iontophoresis 31m/ml Dexamethasone;Patient/family education   PT Next Visit Plan continue with thoracic mobs.    Consulted and Agree with Plan of Care Patient      Patient will benefit from skilled therapeutic intervention in order to improve the following deficits and impairments:  Pain, Impaired UE functional use, Increased muscle spasms  Visit Diagnosis: Muscle spasm of back  Pain in thoracic spine     Problem List Patient Active Problem List   Diagnosis Date Noted  . Hyperlipidemia 10/02/2016  . DDD (degenerative disc disease), cervical 10/02/2016  . Tonsillar mass 09/30/2016  . Patchy loss of hair 05/08/2016  . Adjustment disorder with mixed anxiety and depressed mood 05/08/2016  . Fatigue 05/08/2016  . History of Helicobacter pylori infection 08/26/2015  . Vitamin D insufficiency 08/26/2015  . Tonsil asymmetry 08/26/2015   JKerin Perna PTA 11/09/16 4:50 PM  [address] [place] [address]Phone: [phone]  Fax:  [phone] Name:  Jill ZELLARSMRN: [id number] of Birth: [birth year]  SJeral Pinch PT 11/09/16 4:50 PM

## 2016-11-11 ENCOUNTER — Encounter: Payer: Self-pay | Admitting: Physical Therapy

## 2016-11-11 ENCOUNTER — Ambulatory Visit (INDEPENDENT_AMBULATORY_CARE_PROVIDER_SITE_OTHER): Payer: BLUE CROSS/BLUE SHIELD | Admitting: Physical Therapy

## 2016-11-11 DIAGNOSIS — M62830 Muscle spasm of back: Secondary | ICD-10-CM | POA: Diagnosis not present

## 2016-11-11 DIAGNOSIS — M546 Pain in thoracic spine: Secondary | ICD-10-CM

## 2016-11-11 NOTE — Therapy (Signed)
[facility] [place], [place], [address] Phone: [phone]   Fax:  [phone]  Physical Therapy Treatment  Patient Details  Name: Jill Webb MRN: [id number] Date of Birth: 1965-07-29 Referring Provider: Dr. Dianah Field  Encounter Date: 11/11/2016      PT End of Session - 11/11/16 1553    Visit Number 3   Number of Visits 8   Date for PT Re-Evaluation 12/02/16   PT Start Time 5456   PT Stop Time 1655   PT Time Calculation (min) 60 min   Activity Tolerance Patient tolerated treatment well      History reviewed. No pertinent past medical history.  Past Surgical History:  Procedure Laterality Date  . BUNIONECTOMY  2010  . UTERINE FIBROID SURGERY  2007, 2011    There were no vitals filed for this visit.      Subjective Assessment - 11/11/16 1557    Subjective Pt reports she is feeling good today, was sore after the last visit from the mobilizations.  Didn't do her resisted ex yesterday only the stretches.    Patient Stated Goals sleep per her previous level, get off pain meds if able, not have surgery.    Currently in Pain? No/denies                         [facility] Adult PT Treatment/Exercise - 11/11/16 0001      Shoulder Exercises: Supine   Horizontal ABduction Strengthening;Both;20 reps;Theraband  on whole bolster   Theraband Level (Shoulder Horizontal ABduction) Level 2 (Red)   Other Supine Exercises 10 reps on bolster, marching, followed by modified dead bug 10 each side   Other Supine Exercises 2x10 articulating bridges to work on spinal mobility     Shoulder Exercises: Seated   Other Seated Exercises thoracic mobility, extension. and side bending.      Shoulder Exercises: Standing   Other Standing Exercises 20 reps upper body rotations with blue band     Shoulder Exercises: ROM/Strengthening   UBE (Upper Arm Bike) L2x4' alt FWD/BWD  VC to keep  weight even on  both legs.      Shoulder Exercises: Stretch   Other Shoulder Stretches on whole bolster, chest stretches     Modalities   Modalities Electrical Stimulation;Moist Heat     Moist Heat Therapy   Number Minutes Moist Heat 15 Minutes   Moist Heat Location --  thoracic     Electrical Stimulation   Electrical Stimulation Location Rt thoracic   Electrical Stimulation Action IFC   Electrical Stimulation Parameters to tolerance   Electrical Stimulation Goals Pain                     PT Long Term Goals - 11/09/16 1634      PT LONG TERM GOAL #1   Title I with advanced HEP (12/02/16)    Time 4   Status On-going     PT LONG TERM GOAL #2   Title maintain FOTO with in 2 points of 22% limited ( 12/02/16)    Time 4   Period Weeks   Status On-going     PT LONG TERM GOAL #3   Title report pain reduction in her thoracic area to allow her to sleep per her previous level ( 12/02/16)    Time 4   Period Weeks   Status On-going     PT LONG TERM GOAL #4  Title demo increased mobility of her thoracic and cervical spine without pain ( 12/02/16)    Time 4   Period Weeks   Status On-going               Plan - 11/11/16 1645    Clinical Impression Statement Ashlyn had no pain upon starting therapy. Did develop some Rt sided thoracic pain with ther ex, calmed down with heat and stim at end of session.     Rehab Potential Excellent   PT Frequency 2x / week   PT Treatment/Interventions Moist Heat;Traction;Ultrasound;Therapeutic exercise;Dry needling;Manual techniques;Taping;Neuromuscular re-education;Cryotherapy;Electrical Stimulation;Iontophoresis 4mg /ml Dexamethasone;Patient/family education   PT Next Visit Plan upper back strengthening, spinal mobility either through AROM/ex or mobs    Consulted and Agree with Plan of Care Patient      Patient will benefit from skilled therapeutic intervention in order to improve the following deficits and impairments:  Pain, Impaired UE  functional use, Increased muscle spasms  Visit Diagnosis: Muscle spasm of back  Pain in thoracic spine     Problem List Patient Active Problem List   Diagnosis Date Noted  . Hyperlipidemia 10/02/2016  . DDD (degenerative disc disease), cervical 10/02/2016  . Tonsillar mass 09/30/2016  . Patchy loss of hair 05/08/2016  . Adjustment disorder with mixed anxiety and depressed mood 05/08/2016  . Fatigue 05/08/2016  . History of Helicobacter pylori infection 08/26/2015  . Vitamin D insufficiency 08/26/2015  . Tonsil asymmetry 08/26/2015    Jeral Pinch PT  11/11/2016, 4:47 PM  [facility] [place], [place], [address] Phone: [phone]   Fax:  [phone]  Name: KRISTELLE CAVALLARO MRN: [id number] Date of Birth: 12/09/1965

## 2016-11-16 ENCOUNTER — Ambulatory Visit (INDEPENDENT_AMBULATORY_CARE_PROVIDER_SITE_OTHER): Payer: BLUE CROSS/BLUE SHIELD | Admitting: Physical Therapy

## 2016-11-16 DIAGNOSIS — M546 Pain in thoracic spine: Secondary | ICD-10-CM | POA: Diagnosis not present

## 2016-11-16 DIAGNOSIS — M62830 Muscle spasm of back: Secondary | ICD-10-CM

## 2016-11-16 NOTE — Therapy (Signed)
[place], [place], [address] Phone: [phone]   Fax:  [phone]  Physical Therapy Treatment  Patient Details  Name: Jill Webb MRN: [id number] Date of Birth: 1965/06/08 Referring Provider: Dr. Dianah Field  Encounter Date: 11/16/2016      PT End of Session - 11/16/16 1622    Visit Number 4   Number of Visits 8   Date for PT Re-Evaluation 12/02/16   PT Start Time 8341   PT Stop Time 1702   PT Time Calculation (min) 57 min   Activity Tolerance Patient tolerated treatment well   Behavior During Therapy [facility] for tasks assessed/performed      No past medical history on file.  Past Surgical History:  Procedure Laterality Date  . BUNIONECTOMY  2010  . UTERINE FIBROID SURGERY  2007, 2011    There were no vitals filed for this visit.      Subjective Assessment - 11/16/16 1608    Subjective Pt reports she is feeling good today.  She has done stretches daily, but hasn't done resistance exercise since Friday.  She has a tender spot in her Rt midback, only to touch. She is wondering how she will feel once she is off medication.    Pertinent History h/o dx of military neck.    Patient Stated Goals sleep per her previous level, get off pain meds if able, not have surgery.    Currently in Pain? No/denies   Multiple Pain Sites No            [facility] PT Assessment - 11/16/16 0001      Assessment   Medical Diagnosis Cervical DDD           [facility] Adult PT Treatment/Exercise - 11/16/16 0001      Shoulder Exercises: Supine   Horizontal ABduction Strengthening;Both;10 reps;Theraband  2 sets, on foam roller   Theraband Level (Shoulder Horizontal ABduction) Level 3 (Green)   Flexion Strengthening;Both;10 reps;Theraband  overhead pull   Theraband Level (Shoulder Flexion) Level 3 (Green)   Other Supine Exercises D2 flexion with green band x 10 reps each arm.  Thoracic ext over wide pool  noodle segment by segment.     Other Supine Exercises 2x10 articulating bridges to work on spinal mobility     Shoulder Exercises: Seated   Other Seated Exercises thoracic mobility, extension with head supported by hands.      Shoulder Exercises: Sidelying   Other Sidelying Exercises open book (thoracic rotation) with green band x 10 each side.      Moist Heat Therapy   Number Minutes Moist Heat 10 Minutes   Moist Heat Location --  thoracic     Electrical Stimulation   Electrical Stimulation Location bilat thoracic paraspinals.    Electrical Stimulation Action IFC   Electrical Stimulation Parameters to tolerance    Electrical Stimulation Goals Pain     Manual Therapy   Manual Therapy Myofascial release;Soft tissue mobilization   Manual therapy comments pt prone   Soft tissue mobilization Cross fiber friction to Rt / Lt thoracic paraspinals.    Myofascial Release MFR to bilat mid/low trap, Rt rhomboid, Rt lat.                      PT Long Term Goals - 11/16/16 1623      PT LONG TERM GOAL #1   Title I with advanced HEP (12/02/16)    Time 4   Period  Weeks   Status On-going     PT LONG TERM GOAL #2   Title maintain FOTO with in 2 points of 22% limited ( 12/02/16)    Time 4   Period Weeks   Status On-going     PT LONG TERM GOAL #3   Title report pain reduction in her thoracic area to allow her to sleep per her previous level ( 12/02/16)    Time 4   Period Weeks   Status On-going  sleeping per previous level, however on medication.      PT LONG TERM GOAL #4   Title demo increased mobility of her thoracic and cervical spine without pain ( 12/02/16)    Time 4   Period Weeks   Status On-going               Plan - 11/16/16 1656    Clinical Impression Statement Pt was point tender/tight with manual therapy to mid-thoracic paraspinal; calmed down with heat and estim at end of session.  She tolerated increased resistance with exercise without difficulty/ nor  increased symptoms.   Pt making good progress towards goals.    Rehab Potential Excellent   PT Frequency 2x / week   PT Duration 4 weeks   PT Treatment/Interventions Moist Heat;Traction;Ultrasound;Therapeutic exercise;Dry needling;Manual techniques;Taping;Neuromuscular re-education;Cryotherapy;Electrical Stimulation;Iontophoresis 4mg /ml Dexamethasone;Patient/family education   PT Next Visit Plan upper back strengthening, spinal mobility either through AROM/ex or mobs    Consulted and Agree with Plan of Care Patient      Patient will benefit from skilled therapeutic intervention in order to improve the following deficits and impairments:  Pain, Impaired UE functional use, Increased muscle spasms  Visit Diagnosis: Muscle spasm of back  Pain in thoracic spine     Problem List Patient Active Problem List   Diagnosis Date Noted  . Hyperlipidemia 10/02/2016  . DDD (degenerative disc disease), cervical 10/02/2016  . Tonsillar mass 09/30/2016  . Patchy loss of hair 05/08/2016  . Adjustment disorder with mixed anxiety and depressed mood 05/08/2016  . Fatigue 05/08/2016  . History of Helicobacter pylori infection 08/26/2015  . Vitamin D insufficiency 08/26/2015  . Tonsil asymmetry 08/26/2015   Kerin Perna, PTA 11/16/16 5:04 PM  [place], [place], [address] Phone: [phone]   Fax:  [phone]  Name: Jill Webb MRN: [id number] Date of Birth: 03/07/1966

## 2016-11-17 ENCOUNTER — Ambulatory Visit (INDEPENDENT_AMBULATORY_CARE_PROVIDER_SITE_OTHER): Payer: BLUE CROSS/BLUE SHIELD | Admitting: Sports Medicine

## 2016-11-17 DIAGNOSIS — M5030 Other cervical disc degeneration, unspecified cervical region: Secondary | ICD-10-CM | POA: Diagnosis not present

## 2016-11-17 NOTE — Assessment & Plan Note (Signed)
Resolved with physical therapy, gabapentin, mobic.

## 2016-11-17 NOTE — Progress Notes (Signed)
  Subjective:    CC: Follow-up  HPI: Cervical radiculitis: Resolved with physical therapy, oral medications and gabapentin at bedtime. She is up to 900 mg at bedtime, she tried 3 mg during the day only once and got too drowsy.  Past medical history:  Negative.  See flowsheet/record as well for more information.  Surgical history: Negative.  See flowsheet/record as well for more information.  Family history: Negative.  See flowsheet/record as well for more information.  Social history: Negative.  See flowsheet/record as well for more information.  Allergies, and medications have been entered into the medical record, reviewed, and no changes needed.   Review of Systems: No fevers, chills, night sweats, weight loss, chest pain, or shortness of breath.   Objective:    General: Well Developed, well nourished, and in no acute distress.  Neuro: Alert and oriented x3, extra-ocular muscles intact, sensation grossly intact.  HEENT: Normocephalic, atraumatic, pupils equal round reactive to light, neck supple, no masses, no lymphadenopathy, thyroid nonpalpable.  Skin: Warm and dry, no rashes. Cardiac: Regular rate and rhythm, no murmurs rubs or gallops, no lower extremity edema.  Respiratory: Clear to auscultation bilaterally. Not using accessory muscles, speaking in full sentences. Neck: Negative spurling's Full neck range of motion Grip strength and sensation normal in bilateral hands Strength good C4 to T1 distribution No sensory change to C4 to T1 Reflexes normal  Impression and Recommendations:    DDD (degenerative disc disease), cervical Resolved with physical therapy, gabapentin, mobic.

## 2016-11-19 ENCOUNTER — Encounter: Payer: Self-pay | Admitting: Physical Therapy

## 2016-11-19 ENCOUNTER — Ambulatory Visit (INDEPENDENT_AMBULATORY_CARE_PROVIDER_SITE_OTHER): Payer: BLUE CROSS/BLUE SHIELD | Admitting: Physical Therapy

## 2016-11-19 DIAGNOSIS — M546 Pain in thoracic spine: Secondary | ICD-10-CM

## 2016-11-19 DIAGNOSIS — M62830 Muscle spasm of back: Secondary | ICD-10-CM

## 2016-11-19 NOTE — Patient Instructions (Addendum)
Thoracic Self-Mobilization Stretch (Supine)    With small rolled towel at lower ribs level, gently lie back until stretch is felt. Hold _20-30___ seconds. Relax. Repeat _1-2___ times per set. Do _1___ sets per session. Do __as needed for tightness or a couple times a week__.  Thoracic Self-Mobilization (Sitting)    With rolled towel crosswise at lower ribs level, fingers interlocked behind neck, pull left knee toward chest while stretching back. Hold __20-30__ seconds. Relax. Repeat _1-2___ times per set. Do __1__ sets per session. Do _as needed for tightness or a couple times a week___.  Upper / Lower Extremity Extension (All-Fours)    Tighten stomach and raise right leg and opposite arm. Keep trunk rigid. Hold each one 3-5 sec.  Repeat ___10_ times per set. Do _2-3___ sets per session. Do __1__ sessions per day.  BACK: Child's Pose (Sciatica) have knees a little wide.     Sit in knee-chest position and reach arms forward. Separate knees for comfort. Hold position for __30_ secs. Repeat __1_ times. Do _as needed for stretching out back__ times per day.  Copyright  VHI. All rights reserved.

## 2016-11-19 NOTE — Therapy (Signed)
[place], [place], [address] Phone: [phone]   Fax:  [phone]  Physical Therapy Treatment  Patient Details  Name: Jill Webb MRN: [id number] Date of Birth: 06-Feb-1966 Referring Provider: Dr. Dianah Field  Encounter Date: 11/19/2016      PT End of Session - 11/19/16 1652    Visit Number 5   Number of Visits 8   Date for PT Re-Evaluation 12/02/16   PT Start Time 6967   PT Stop Time 1750   PT Time Calculation (min) 58 min   Activity Tolerance Patient tolerated treatment well      History reviewed. No pertinent past medical history.  Past Surgical History:  Procedure Laterality Date  . BUNIONECTOMY  2010  . UTERINE FIBROID SURGERY  2007, 2011    There were no vitals filed for this visit.      Subjective Assessment - 11/19/16 1654    Subjective Pt report she is still doing well, hasn't returned to all her activities.  She will return to them this weekend and see how she feels.    Patient Stated Goals sleep per her previous level, get off pain meds if able, not have surgery.    Currently in Pain? No/denies            [facility] PT Assessment - 11/19/16 0001      Assessment   Medical Diagnosis Cervical DDD     Observation/Other Assessments   Focus on Therapeutic Outcomes (FOTO)  13% limited                     [facility] Adult PT Treatment/Exercise - 11/19/16 0001      Self-Care   Self-Care Other Self-Care Comments   Other Self-Care Comments  TPR to thoracic area and upper traps using a ball, standing and supine  also tried with pool noodle supine     Shoulder Exercises: Seated   Other Seated Exercises thoracic mobility, extension with head supported by hands.      Shoulder Exercises: Sidelying   Other Sidelying Exercises quadraped alt arm/leg lifts 2x10     Shoulder Exercises: ROM/Strengthening   UBE (Upper Arm Bike) L2x4' alt FWD/BWD     Shoulder  Exercises: Stretch   Other Shoulder Stretches upper back stretches with hands grasped in front. shoulder stretches using strap in the door.      Modalities   Modalities Electrical Stimulation;Moist Heat     Moist Heat Therapy   Number Minutes Moist Heat 15 Minutes   Moist Heat Location Shoulder;Lumbar Spine  thoracic, Lt shoulder     Electrical Stimulation   Electrical Stimulation Location bilat thoracic paraspinals.    Electrical Stimulation Action IFC   Electrical Stimulation Parameters  to tolerance   Electrical Stimulation Goals Pain                     PT Long Term Goals - 11/19/16 1657      PT LONG TERM GOAL #1   Title I with advanced HEP (12/02/16)    Status On-going     PT LONG TERM GOAL #2   Title maintain FOTO with in 2 points of 22% limited ( 12/02/16)    Status Achieved  scored 13% limited     PT LONG TERM GOAL #3   Title report pain reduction in her thoracic area to allow her to sleep per her previous level ( 12/02/16)    Status On-going  PT LONG TERM GOAL #4   Title demo increased mobility of her thoracic and cervical spine without pain ( 12/02/16)    Status Achieved               Plan - 11/19/16 1734    Clinical Impression Statement Jill Webb is making good progress, she has met her FOTO goal.  Her pain stays low or absent however she hasn't returned to all of her activities and she is a little fearful as well. Jill Webb is going to add in more activity this weekend and she how she tolerates it.  If she does well we will discharge. After todays strengthening exercise she reported increased tightness in her thoracic area, this resolved with heat and stim.    Rehab Potential Excellent   PT Frequency 2x / week   PT Duration 4 weeks   PT Treatment/Interventions Moist Heat;Traction;Ultrasound;Therapeutic exercise;Dry needling;Manual techniques;Taping;Neuromuscular re-education;Cryotherapy;Electrical Stimulation;Iontophoresis 48m/ml  Dexamethasone;Patient/family education   PT Next Visit Plan possible discharge if still doing well with increasing her activity   Consulted and Agree with Plan of Care Patient      Patient will benefit from skilled therapeutic intervention in order to improve the following deficits and impairments:  Pain, Impaired UE functional use, Increased muscle spasms  Visit Diagnosis: Pain in thoracic spine  Muscle spasm of back     Problem List Patient Active Problem List   Diagnosis Date Noted  . Hyperlipidemia 10/02/2016  . DDD (degenerative disc disease), cervical 10/02/2016  . Tonsillar mass 09/30/2016  . Patchy loss of hair 05/08/2016  . Adjustment disorder with mixed anxiety and depressed mood 05/08/2016  . Fatigue 05/08/2016  . History of Helicobacter pylori infection 08/26/2015  . Vitamin D insufficiency 08/26/2015  . Tonsil asymmetry 08/26/2015    SManuela Schwartzshaver PT  11/19/2016, 5:45 PM  [facility] [address]Phone: [phone]  Fax:  [phone] Name: HQUANIYAH BUGHMRN: [id number] of Birth: 106-01-1966

## 2016-11-23 ENCOUNTER — Encounter: Payer: BLUE CROSS/BLUE SHIELD | Admitting: Physical Therapy

## 2016-11-25 ENCOUNTER — Encounter: Payer: BLUE CROSS/BLUE SHIELD | Admitting: Physical Therapy

## 2016-11-30 ENCOUNTER — Ambulatory Visit (INDEPENDENT_AMBULATORY_CARE_PROVIDER_SITE_OTHER): Payer: BLUE CROSS/BLUE SHIELD | Admitting: Physical Therapy

## 2016-11-30 DIAGNOSIS — M62830 Muscle spasm of back: Secondary | ICD-10-CM | POA: Diagnosis not present

## 2016-11-30 DIAGNOSIS — M546 Pain in thoracic spine: Secondary | ICD-10-CM

## 2016-11-30 NOTE — Therapy (Addendum)
[place], [place], [address] Phone: [phone]   Fax:  [phone]  Physical Therapy Treatment  Patient Details  Name: Jill Webb MRN: [id number] Date of Birth: [birth year] Referring Provider: Dr. Dianah Field  Encounter Date: 11/30/2016      PT End of Session - 11/30/16 1611    Visit Number 6   Number of Visits 8   Date for PT Re-Evaluation 12/02/16   PT Start Time 4166   PT Stop Time 1632   PT Time Calculation (min) 28 min   Activity Tolerance Patient tolerated treatment well;No increased pain   Behavior During Therapy [facility] for tasks assessed/performed      No past medical history on file.  Past Surgical History:  Procedure Laterality Date  . BUNIONECTOMY  2010  . UTERINE FIBROID SURGERY  2007, 2011    There were no vitals filed for this visit.      Subjective Assessment - 11/30/16 1639    Subjective Pt reports she has been doing a beginner yoga video and it has been helpful to keep her back limber.  She still hasn't returned to all her old exercises yet due to fear of increased back pain.  Overall, she is happy with her progress.    Patient Stated Goals sleep per her previous level, get off pain meds if able, not have surgery.    Currently in Pain? No/denies  only tender to touch with pressure            [facility] Adult PT Treatment/Exercise - 11/30/16 0001      Shoulder Exercises: Sidelying   Other Sidelying Exercises prone alternating opp arm/leg lifts 2x10     Shoulder Exercises: ROM/Strengthening   UBE (Upper Arm Bike) L2x4' alt FWD/BWD standing on Bosu     Shoulder Exercises: Stretch   Other Shoulder Stretches upper back stretches (thoracic ext) with hands grasped behind head x 8 reps. shoulder stretches using strap in the door. x 3 reps     Modalities   Modalities --  declined.      Verbally reviewed all other exercises in HEP.   Educated pt on use of ice to  thoracic spine when tired/painful, including what to expect, rationale, and parameters. Pt verbalized understanding.         PT Long Term Goals - 11/30/16 1621      PT LONG TERM GOAL #1   Title I with advanced HEP (12/02/16)    Time 4   Period Weeks   Status On-going     PT LONG TERM GOAL #2   Title maintain FOTO with in 2 points of 22% limited ( 12/02/16)    Time 4   Period Weeks   Status Achieved     PT LONG TERM GOAL #3   Title report pain reduction in her thoracic area to allow her to sleep per her previous level ( 12/02/16)    Time 4   Period Weeks   Status Achieved     PT LONG TERM GOAL #4   Title demo increased mobility of her thoracic and cervical spine without pain ( 12/02/16)    Time 4   Period Weeks   Status Achieved               Plan - 11/30/16 1635    Clinical Impression Statement Pt has been pain free since last session.  She tolerated all exercises well, without production of symptoms.  She is  near meeting all goals and would like to hold therapy for 2 wks, in case of flare up.     Rehab Potential Excellent   PT Frequency 2x / week   PT Duration 4 weeks   PT Treatment/Interventions Moist Heat;Traction;Ultrasound;Therapeutic exercise;Dry needling;Manual techniques;Taping;Neuromuscular re-education;Cryotherapy;Electrical Stimulation;Iontophoresis 82m/ml Dexamethasone;Patient/family education   PT Next Visit Plan Will hold for 2 wks per pt request.  If pt doesn't return by 7/16, will d/c to HEP.    Consulted and Agree with Plan of Care Patient      Patient will benefit from skilled therapeutic intervention in order to improve the following deficits and impairments:  Pain, Impaired UE functional use, Increased muscle spasms  Visit Diagnosis: Pain in thoracic spine  Muscle spasm of back     Problem List Patient Active Problem List   Diagnosis Date Noted  . Hyperlipidemia 10/02/2016  . DDD (degenerative disc disease), cervical 10/02/2016  .  Tonsillar mass 09/30/2016  . Patchy loss of hair 05/08/2016  . Adjustment disorder with mixed anxiety and depressed mood 05/08/2016  . Fatigue 05/08/2016  . History of Helicobacter pylori infection 08/26/2015  . Vitamin D insufficiency 08/26/2015  . Tonsil asymmetry 08/26/2015   JKerin Perna PTA 11/30/16 4:45 PM  [facility] [address] [address] [address]Phone: [phone]  Fax:  [phone] Name: Jill TATESMRN: [id number] of Birth: [birth year]  PHYSICAL THERAPY DISCHARGE SUMMARY  Visits from Start of Care: 6  Current functional level related to goals / functional outcomes: See above   Remaining deficits: She is slowly returning to her prior level of activity   Education / Equipment: HEP Plan: Patient agrees to discharge.  Patient goals were partially met. Patient is being discharged due to being pleased with the current functional level.  ?????    SJeral Pinch PT 01/04/17 10:30 AM

## 2016-12-03 ENCOUNTER — Encounter: Payer: BLUE CROSS/BLUE SHIELD | Admitting: Physical Therapy

## 2016-12-04 ENCOUNTER — Ambulatory Visit: Payer: BLUE CROSS/BLUE SHIELD | Admitting: Physician Assistant

## 2016-12-04 ENCOUNTER — Ambulatory Visit: Payer: BLUE CROSS/BLUE SHIELD | Admitting: Osteopathic Medicine

## 2016-12-09 ENCOUNTER — Other Ambulatory Visit: Payer: Self-pay | Admitting: *Deleted

## 2016-12-09 DIAGNOSIS — M5030 Other cervical disc degeneration, unspecified cervical region: Secondary | ICD-10-CM

## 2016-12-09 MED ORDER — MELOXICAM 15 MG PO TABS: 15.0000 mg | ORAL_TABLET | Freq: Every day | ORAL | 0 refills | Status: DC | PRN

## 2017-01-20 ENCOUNTER — Encounter: Payer: Self-pay | Admitting: Physician Assistant

## 2017-01-20 ENCOUNTER — Ambulatory Visit (INDEPENDENT_AMBULATORY_CARE_PROVIDER_SITE_OTHER): Payer: BLUE CROSS/BLUE SHIELD | Admitting: Physician Assistant

## 2017-01-20 VITALS — BP 106/82 | HR 83 | Wt 199.0 lb

## 2017-01-20 DIAGNOSIS — H02723 Madarosis of right eye, unspecified eyelid and periocular area: Secondary | ICD-10-CM

## 2017-01-20 DIAGNOSIS — F419 Anxiety disorder, unspecified: Secondary | ICD-10-CM

## 2017-01-20 DIAGNOSIS — M5030 Other cervical disc degeneration, unspecified cervical region: Secondary | ICD-10-CM

## 2017-01-20 DIAGNOSIS — Z23 Encounter for immunization: Secondary | ICD-10-CM | POA: Diagnosis not present

## 2017-01-20 DIAGNOSIS — F329 Major depressive disorder, single episode, unspecified: Secondary | ICD-10-CM | POA: Diagnosis not present

## 2017-01-20 DIAGNOSIS — F32A Depression, unspecified: Secondary | ICD-10-CM

## 2017-01-20 MED ORDER — LORAZEPAM 0.5 MG PO TABS: 0.5000 mg | ORAL_TABLET | Freq: Three times a day (TID) | ORAL | 0 refills | Status: DC | PRN

## 2017-01-20 MED ORDER — GABAPENTIN 300 MG PO CAPS: ORAL_CAPSULE | ORAL | 3 refills | Status: DC

## 2017-01-20 NOTE — Progress Notes (Signed)
Subjective:    Patient ID: Jill Webb, female    DOB: [birth year], 51 y.o.   MRN: [id number]  HPI  Pt is a 51 yo female who presents to the clinic to follow up.   She does feel like her anxiety is worsening. She is the worst at night when she goes to lay down and almost feels like her chest gets tight. She feels like lexapro helps but feels like she needs more. Her depression is better. She has much more motivation.  Her eyelashes started to fall out in July. Stable now but right side almost completely gone and left falling out. She is not picking at them. Only new medication is gabapentin. She does feel like helping with cervical DDD.   .. Active Ambulatory Problems    Diagnosis Date Noted  . History of Helicobacter pylori infection 08/26/2015  . Vitamin D insufficiency 08/26/2015  . Tonsil asymmetry 08/26/2015  . Patchy loss of hair 05/08/2016  . Adjustment disorder with mixed anxiety and depressed mood 05/08/2016  . Fatigue 05/08/2016  . Tonsillar mass 09/30/2016  . Hyperlipidemia 10/02/2016  . DDD (degenerative disc disease), cervical 10/02/2016  . Loss of eyelashes, right 01/22/2017  . Anxiety and depression 01/22/2017   Resolved Ambulatory Problems    Diagnosis Date Noted  . No Resolved Ambulatory Problems   No Additional Past Medical History          Review of Systems    see HPI.  Objective:   Physical Exam  Constitutional: She is oriented to person, place, and time. She appears well-developed and well-nourished.  HENT:  Head: Normocephalic and atraumatic.  Eyes:  Diffuse loss of eyelashes worse on right than left. No erythema, eye discharge, eye swelling.   Neck: Normal range of motion. Neck supple.  Cardiovascular: Normal rate, regular rhythm and normal heart sounds.   Pulmonary/Chest: Effort normal and breath sounds normal.  Lymphadenopathy:    She has no cervical adenopathy.  Neurological: She is alert and oriented to person, place, and time.   Psychiatric: She has a normal mood and affect. Her behavior is normal.          Assessment & Plan:  Marland KitchenMarland KitchenLahela was seen today for anxiety.  Diagnoses and all orders for this visit:  Anxiety and depression  DDD (degenerative disc disease), cervical -     gabapentin (NEURONTIN) 300 MG capsule; One tab PO qHS for a week, then BID for a week, then TID. May double weekly to a max of 3,600mg /day  Need for influenza vaccination -     Flu Vaccine QUAD 6+ mos PF IM (Fluarix Quad PF)  Loss of eyelashes, right  Other orders -     LORazepam (ATIVAN) 0.5 MG tablet; Take 1 tablet (0.5 mg total) by mouth every 8 (eight) hours as needed for anxiety.   .. GAD 7 : Generalized Anxiety Score 01/20/2017 06/05/2016 05/08/2016  Nervous, Anxious, on Edge 2 1 3   Control/stop worrying 0 0 2  Worry too much - different things 0 0 0  Trouble relaxing 2 0 1  Restless 0 0 2  Easily annoyed or irritable 1 0 2  Afraid - awful might happen 0 0 0  Total GAD 7 Score 5 1 10   Anxiety Difficulty Somewhat difficult Not difficult at all Not difficult at all    .[name] Depression screen [facility] 2/9 01/20/2017 09/30/2016 06/05/2016 05/08/2016  Decreased Interest 0 0 0 2  Down, Depressed, Hopeless 1 0 0 3  PHQ -  2 Score 1 0 0 5  Altered sleeping - - 0 3  Tired, decreased energy - - 1 3  Change in appetite - - 2 3  Feeling bad or failure about yourself  - - 0 2  Trouble concentrating - - 0 0  Moving slowly or fidgety/restless - - 0 3  Suicidal thoughts - - 0 0  PHQ-9 Score - - 3 19   Her anxiety remains to be difficult for her. Increased to 10mg  with ativan as needed.   Pt believes gabapentin is helping some with cervical DDD and neck pain. I feel like loss of eyelashes could be a side effect. Discussed with patient if she wanted to consider stopping to see if eyelashes stop falling out or grow back. Timeline fits for side effect.

## 2017-01-20 NOTE — Patient Instructions (Signed)
Increase lexapro to 10mg .  Ativan as needed.

## 2017-01-22 ENCOUNTER — Encounter: Payer: Self-pay | Admitting: Physician Assistant

## 2017-01-22 DIAGNOSIS — F419 Anxiety disorder, unspecified: Principal | ICD-10-CM

## 2017-01-22 DIAGNOSIS — H02723 Madarosis of right eye, unspecified eyelid and periocular area: Secondary | ICD-10-CM | POA: Insufficient documentation

## 2017-01-22 DIAGNOSIS — F329 Major depressive disorder, single episode, unspecified: Secondary | ICD-10-CM | POA: Insufficient documentation

## 2017-01-22 DIAGNOSIS — F32A Depression, unspecified: Secondary | ICD-10-CM | POA: Insufficient documentation

## 2017-02-16 ENCOUNTER — Encounter: Payer: Self-pay | Admitting: Physician Assistant

## 2017-02-16 DIAGNOSIS — H02723 Madarosis of right eye, unspecified eyelid and periocular area: Secondary | ICD-10-CM

## 2017-02-24 ENCOUNTER — Encounter: Payer: Self-pay | Admitting: Physician Assistant

## 2017-02-24 ENCOUNTER — Other Ambulatory Visit: Payer: Self-pay | Admitting: Physician Assistant

## 2017-03-08 ENCOUNTER — Other Ambulatory Visit: Payer: Self-pay | Admitting: Sports Medicine

## 2017-03-08 DIAGNOSIS — M5030 Other cervical disc degeneration, unspecified cervical region: Secondary | ICD-10-CM

## 2017-03-09 DIAGNOSIS — L579 Skin changes due to chronic exposure to nonionizing radiation, unspecified: Secondary | ICD-10-CM | POA: Diagnosis not present

## 2017-03-09 DIAGNOSIS — L638 Other alopecia areata: Secondary | ICD-10-CM | POA: Diagnosis not present

## 2017-03-15 ENCOUNTER — Encounter: Payer: Self-pay | Admitting: Physician Assistant

## 2017-03-15 ENCOUNTER — Ambulatory Visit (INDEPENDENT_AMBULATORY_CARE_PROVIDER_SITE_OTHER): Payer: BLUE CROSS/BLUE SHIELD

## 2017-03-15 ENCOUNTER — Ambulatory Visit (INDEPENDENT_AMBULATORY_CARE_PROVIDER_SITE_OTHER): Payer: BLUE CROSS/BLUE SHIELD | Admitting: Physician Assistant

## 2017-03-15 VITALS — BP 129/76 | HR 84 | Temp 98.6°F | Wt 213.0 lb

## 2017-03-15 DIAGNOSIS — R05 Cough: Secondary | ICD-10-CM | POA: Diagnosis not present

## 2017-03-15 DIAGNOSIS — R058 Other specified cough: Secondary | ICD-10-CM

## 2017-03-15 MED ORDER — AZITHROMYCIN 250 MG PO TABS: ORAL_TABLET | ORAL | 0 refills | Status: DC

## 2017-03-15 MED ORDER — BENZONATATE 200 MG PO CAPS: 200.0000 mg | ORAL_CAPSULE | Freq: Three times a day (TID) | ORAL | 0 refills | Status: DC | PRN

## 2017-03-15 NOTE — Patient Instructions (Addendum)
- chest x-ray today - take antibiotic as prescribed for 5 days - tessalon pearls up to three times daily for cough - Delsym - follow instructions on label   Acute Bronchitis, Adult Acute bronchitis is sudden (acute) swelling of the air tubes (bronchi) in the lungs. Acute bronchitis causes these tubes to fill with mucus, which can make it hard to breathe. It can also cause coughing or wheezing. In adults, acute bronchitis usually goes away within 2 weeks. A cough caused by bronchitis may last up to 3 weeks. Smoking, allergies, and asthma can make the condition worse. Repeated episodes of bronchitis may cause further lung problems, such as chronic obstructive pulmonary disease (COPD). What are the causes? This condition can be caused by germs and by substances that irritate the lungs, including:  Cold and flu viruses. This condition is most often caused by the same virus that causes a cold.  Bacteria.  Exposure to tobacco smoke, dust, fumes, and air pollution.  What increases the risk? This condition is more likely to develop in people who:  Have close contact with someone with acute bronchitis.  Are exposed to lung irritants, such as tobacco smoke, dust, fumes, and vapors.  Have a weak immune system.  Have a respiratory condition such as asthma.  What are the signs or symptoms? Symptoms of this condition include:  A cough.  Coughing up clear, yellow, or green mucus.  Wheezing.  Chest congestion.  Shortness of breath.  A fever.  Body aches.  Chills.  A sore throat.  How is this diagnosed? This condition is usually diagnosed with a physical exam. During the exam, your health care provider may order tests, such as chest X-rays, to rule out other conditions. He or she may also:  Test a sample of your mucus for bacterial infection.  Check the level of oxygen in your blood. This is done to check for pneumonia.  Do a chest X-ray or lung function testing to rule out  pneumonia and other conditions.  Perform blood tests.  Your health care provider will also ask about your symptoms and medical history. How is this treated? Most cases of acute bronchitis clear up over time without treatment. Your health care provider may recommend:  Drinking more fluids. Drinking more makes your mucus thinner, which may make it easier to breathe.  Taking a medicine for a fever or cough.  Taking an antibiotic medicine.  Using an inhaler to help improve shortness of breath and to control a cough.  Using a cool mist vaporizer or humidifier to make it easier to breathe.  Follow these instructions at home: Medicines  Take over-the-counter and prescription medicines only as told by your health care provider.  If you were prescribed an antibiotic, take it as told by your health care provider. Do not stop taking the antibiotic even if you start to feel better. General instructions  Get plenty of rest.  Drink enough fluids to keep your urine clear or pale yellow.  Avoid smoking and secondhand smoke. Exposure to cigarette smoke or irritating chemicals will make bronchitis worse. If you smoke and you need help quitting, ask your health care provider. Quitting smoking will help your lungs heal faster.  Use an inhaler, cool mist vaporizer, or humidifier as told by your health care provider.  Keep all follow-up visits as told by your health care provider. This is important. How is this prevented? To lower your risk of getting this condition again:  Wash your hands often with  soap and water. If soap and water are not available, use hand sanitizer.  Avoid contact with people who have cold symptoms.  Try not to touch your hands to your mouth, nose, or eyes.  Make sure to get the flu shot every year.  Contact a health care provider if:  Your symptoms do not improve in 2 weeks of treatment. Get help right away if:  You cough up blood.  You have chest pain.  You  have severe shortness of breath.  You become dehydrated.  You faint or keep feeling like you are going to faint.  You keep vomiting.  You have a severe headache.  Your fever or chills gets worse. This information is not intended to replace advice given to you by your health care provider. Make sure you discuss any questions you have with your health care provider. Document Released: 06/25/2004 Document Revised: 12/11/2015 Document Reviewed: 11/06/2015 Elsevier Interactive Patient Education  2017 [employer].

## 2017-03-15 NOTE — Progress Notes (Signed)
Good morning,  Chest x-ray is normal. Cough is likely due to bronchitis. Treatment plan does not change.  Best, Evlyn Clines

## 2017-03-15 NOTE — Progress Notes (Signed)
HPI:                                                                Jill Webb is a 51 y.o. female who presents to [facility] today for cough  Cough  This is a new problem. The current episode started 1 to 4 weeks ago (x 3 weeks). The problem has been unchanged. The problem occurs every few minutes. The cough is non-productive. Pertinent negatives include no chills, fever, myalgias, nasal congestion, sore throat (resolved), shortness of breath, sweats or wheezing. Nothing aggravates the symptoms. Risk factors: never smoker. Treatments tried: + cough drops.     No past medical history on file. Past Surgical History:  Procedure Laterality Date  . BUNIONECTOMY  2010  . UTERINE FIBROID SURGERY  2007, 2011   Social History  Substance Use Topics  . Smoking status: Never Smoker  . Smokeless tobacco: Never Used  . Alcohol use 0.0 oz/week   family history includes Cancer in her sister; Hypothyroidism in her mother.  ROS: negative except as noted in the HPI  Medications: Current Outpatient Prescriptions  Medication Sig Dispense Refill  . AMBULATORY NON FORMULARY MEDICATION Take 350 mg by mouth daily. Mega Red    . BIOTIN PO Take by mouth.    . Cholecalciferol (VITAMIN D3) 5000 units TABS Take 5,000 Units by mouth daily.    . clobetasol ointment (TEMOVATE) 4.26 % Apply 1 application topically 2 (two) times daily. 60 g 0  . escitalopram (LEXAPRO) 5 MG tablet Take 1 tablet (5 mg total) by mouth daily. 90 tablet 4  . gabapentin (NEURONTIN) 300 MG capsule One tab PO qHS for a week, then BID for a week, then TID. May double weekly to a max of 3,600mg /day 60 capsule 3  . LORazepam (ATIVAN) 0.5 MG tablet Take 1 tablet (0.5 mg total) by mouth every 8 (eight) hours as needed for anxiety. 30 tablet 0  . meloxicam (MOBIC) 15 MG tablet TAKE 1 TABLET (15 MG TOTAL) BY MOUTH DAILY AS NEEDED FOR PAIN. 90 tablet 0  . Prenatal Vit-Fe Fumarate-FA  (PRENATAL ONE DAILY PO) Take by mouth.    [name] VITAMIN K PO Take 150 mcg by mouth daily.    [name] azithromycin (ZITHROMAX Z-PAK) 250 MG tablet Take 2 tablets (500 mg) on  Day 1,  followed by 1 tablet (250 mg) once daily on Days 2 through 5. 6 tablet 0  . benzonatate (TESSALON) 200 MG capsule Take 1 capsule (200 mg total) by mouth 3 (three) times daily as needed for cough. 45 capsule 0   No current facility-administered medications for this visit.    Allergies  Allergen Reactions  . Codeine Rash       Objective:  BP 129/76   Pulse 84   Temp 98.6 F (37 C) (Oral)   Wt 213 lb (96.6 kg)   SpO2 97%   BMI 32.39 kg/m  Gen:  alert, not ill-appearing, no distress, appropriate for age HEENT: head normocephalic without obvious abnormality, conjunctiva and cornea clear, oropharynx clear, left tonsil stone, no exudates, uvula midline, trachea midline Pulm: Normal work of breathing, normal phonation, breath sounds diminished on expiration, no wheezes, rales or rhonchi CV: Normal rate, regular rhythm, s1  and s2 distinct, no murmurs, clicks or rubs  Neuro: alert and oriented x 3, no tremor MSK: extremities atraumatic, normal gait and station Skin: intact, no rashes on exposed skin, no cyanosis   No results found for this or any previous visit (from the past 72 hour(s)). No results found.   Assessment and Plan: 51 y.o. female with   1. Cough present for greater than 3 weeks - DG Chest 2 View to assess for infiltrate. SpO2 97% on RA. No evidence of respiratory distress. Differential is acute bronchitis, pertussis, versus upper airway cough syndrome  - azithromycin (ZITHROMAX Z-PAK) 250 MG tablet; Take 2 tablets (500 mg) on  Day 1,  followed by 1 tablet (250 mg) once daily on Days 2 through 5.  Dispense: 6 tablet; Refill: 0 - benzonatate (TESSALON) 200 MG capsule; Take 1 capsule (200 mg total) by mouth 3 (three) times daily as needed for cough.  Dispense: 45 capsule; Refill: 0   Patient education  and anticipatory guidance given Patient agrees with treatment plan Follow-up as needed if symptoms worsen or fail to improve  Darlyne Russian PA-C

## 2017-04-27 DIAGNOSIS — J358 Other chronic diseases of tonsils and adenoids: Secondary | ICD-10-CM | POA: Diagnosis not present

## 2017-05-18 DIAGNOSIS — M7742 Metatarsalgia, left foot: Secondary | ICD-10-CM | POA: Diagnosis not present

## 2017-05-18 DIAGNOSIS — M2012 Hallux valgus (acquired), left foot: Secondary | ICD-10-CM | POA: Diagnosis not present

## 2017-05-26 ENCOUNTER — Ambulatory Visit (INDEPENDENT_AMBULATORY_CARE_PROVIDER_SITE_OTHER): Payer: BLUE CROSS/BLUE SHIELD | Admitting: Physician Assistant

## 2017-05-26 ENCOUNTER — Ambulatory Visit: Payer: BLUE CROSS/BLUE SHIELD

## 2017-05-26 ENCOUNTER — Ambulatory Visit (INDEPENDENT_AMBULATORY_CARE_PROVIDER_SITE_OTHER): Payer: BLUE CROSS/BLUE SHIELD

## 2017-05-26 ENCOUNTER — Encounter: Payer: Self-pay | Admitting: Physician Assistant

## 2017-05-26 VITALS — BP 130/83 | HR 83 | Temp 98.7°F | Resp 16 | Ht 68.0 in | Wt 210.0 lb

## 2017-05-26 DIAGNOSIS — D259 Leiomyoma of uterus, unspecified: Secondary | ICD-10-CM | POA: Diagnosis not present

## 2017-05-26 DIAGNOSIS — N920 Excessive and frequent menstruation with regular cycle: Secondary | ICD-10-CM | POA: Insufficient documentation

## 2017-05-26 DIAGNOSIS — E559 Vitamin D deficiency, unspecified: Secondary | ICD-10-CM | POA: Diagnosis not present

## 2017-05-26 DIAGNOSIS — H5789 Other specified disorders of eye and adnexa: Secondary | ICD-10-CM

## 2017-05-26 DIAGNOSIS — N950 Postmenopausal bleeding: Secondary | ICD-10-CM | POA: Diagnosis not present

## 2017-05-26 DIAGNOSIS — R5383 Other fatigue: Secondary | ICD-10-CM | POA: Insufficient documentation

## 2017-05-26 DIAGNOSIS — N898 Other specified noninflammatory disorders of vagina: Secondary | ICD-10-CM

## 2017-05-26 MED ORDER — ERGOCALCIFEROL 1.25 MG (50000 UT) PO CAPS: 50000.0000 [IU] | ORAL_CAPSULE | ORAL | 0 refills | Status: DC

## 2017-05-26 NOTE — Progress Notes (Signed)
Subjective:    Patient ID: Jill Webb, female    DOB: 05/25/1966, 51 y.o.   MRN: [id number]  HPI  Patient is a 51 year old female who presents to the clinic with multiple concerns today.  Her main concern is some vaginal discharge that she has noticed for the last few months.  It is clear to white in color.  She denies any itching or pain.  She thought at first she was just more sweaty in her vaginal area.  More recently it has accompanied her by minor abdominal cramps.  She relates these to feel like she is ovulating.  Last week she did have some spotting.  She is concerned because she is in menopause and has not had a period in over a year.  Her last Pap was 2017 and normal.  She denies any new sexual partners.  She does mention some fatigue that is concerning.  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  At times she can come home and start working on her schoolwork and just fall asleep.  She is concerned that she is more fatigued than she should be.  She denies any depression or feelings of hopelessness or helplessness.  For the most part she wakes up feeling well rested.  She feels like she gets adequate amount of sleep at night and sleeps well.  She does not exercise.  She does have a history of vitamin D insufficiency and deficiency.  She has not taking any vitamin D supplements.  She also mentions a heavy sensation in her eyes that can occur at anytime of the day.  She correlates this feeling to when contacts have been your wall and make her eyes feel heavy.  She denies any vision changes.  She denies any headaches.  She is due to go to the eye doctor at the beginning of next year.  She also mentioned her hospital puffier than normal.  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  No know fam hx of ovarian/uterine cancer. BRACA negative.   .. Active Ambulatory Problems    Diagnosis Date Noted  . History of Helicobacter  pylori infection 08/26/2015  . Vitamin D insufficiency 08/26/2015  . Tonsil asymmetry 08/26/2015  . Patchy loss of hair 05/08/2016  . Adjustment disorder with mixed anxiety and depressed mood 05/08/2016  . Fatigue 05/08/2016  . Tonsillar mass 09/30/2016  . Hyperlipidemia 10/02/2016  . DDD (degenerative disc disease), cervical 10/02/2016  . Loss of eyelashes, right 01/22/2017  . Anxiety and depression 01/22/2017  . Eyes swollen 05/26/2017  . Spotting 05/26/2017  . Vaginal discharge 05/26/2017  . No energy 05/26/2017   Resolved Ambulatory Problems    Diagnosis Date Noted  . No Resolved Ambulatory Problems   No Additional Past Medical History       Review of Systems  All other systems reviewed and are negative.      Objective:   Physical Exam  Constitutional: She is oriented to person, place, and time. She appears well-developed and well-nourished.  HENT:  Head: Normocephalic and atraumatic.  Eyes: Conjunctivae and EOM are normal. Pupils are equal, round, and reactive to light. Right eye exhibits no discharge. Left eye exhibits no discharge.  Peripheral vision intact.  No eyelashes upper lid of right eye.   Neck: Normal range of motion. Neck supple. No thyromegaly present.  Cardiovascular: Normal rate, regular rhythm and normal heart sounds.  Pulmonary/Chest:  Effort normal and breath sounds normal. She has no wheezes.  Abdominal: Soft. Bowel sounds are normal. She exhibits no distension and no mass. There is no tenderness. There is no rebound and no guarding.  Neurological: She is alert and oriented to person, place, and time.  Psychiatric: She has a normal mood and affect. Her behavior is normal.          Assessment & Plan:  Marland KitchenMarland KitchenPeytin was seen today for abdominal cramping and vaginal bleeding.  Diagnoses and all orders for this visit:  Vaginal discharge -     C. trachomatis/N. gonorrhoeae RNA -     WET PREP FOR TRICH, YEAST, CLUE  Spotting -     US PELVIS  (TRANSABDOMINAL ONLY) -     US PELVIS TRANSVANGINAL NON-OB (TV ONLY)  Eyes swollen  No energy  Vitamin D insufficiency -     ergocalciferol (VITAMIN D2) 50000 units capsule; Take 1 capsule (50,000 Units total) by mouth once a week.  Post-menopausal bleeding -     US PELVIS (TRANSABDOMINAL ONLY) -     US PELVIS TRANSVANGINAL NON-OB (TV ONLY)   To evaluate her vaginal discharge will get a wet prep today and also a GC and chlamydia.  It is unlikely this is a sexually transmitted disease as she has had the symptoms for a few months now.  This could be normal discharge.  Since she was without a period for a year and had some spotting will work this up as postmenopausal bleeding.  Pelvic ultrasound was ordered today.  Patient had had labs done in May of this year.  We will hold on ordering any additional labs. Up to date pap smear.   As well as for her fatigue.  Will start high-dose weekly vitamin D and recheck in 2-3 months.  If there is no significant improvement we will recheck labs such as thyroid, B12, vitamin D.  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  This makes me suspicious for other autoimmune diseases.  Patient did mention some eyelid heaviness which makes me concern for myasthenia gravis.  I definitely think this is a Cipro to potentially be evaluated if symptoms persist.  Her eye exam today was completely normal.  When we check labs I will check an ANA and ESR.   Discussed with patient that certainly working full-time and in school can be exhausting.  I know it is hard to find time to exercise but this can also give you energy.  Discussed to do her schoolwork in a well lit functioning area.  Avoid beds and couches where she could fall asleep.  Since her labs were checked in May we will hold on lab check and see how she is doing with the vitamin D.  [name].Spent 30 minutes with patient and greater than 50 percent of visit spent counseling patient regarding treatment  plan.

## 2017-05-27 ENCOUNTER — Other Ambulatory Visit: Payer: Self-pay | Admitting: Physician Assistant

## 2017-05-27 ENCOUNTER — Telehealth: Payer: Self-pay

## 2017-05-27 ENCOUNTER — Encounter: Payer: Self-pay | Admitting: Physician Assistant

## 2017-05-27 ENCOUNTER — Other Ambulatory Visit: Payer: Self-pay | Admitting: Sports Medicine

## 2017-05-27 DIAGNOSIS — N920 Excessive and frequent menstruation with regular cycle: Secondary | ICD-10-CM

## 2017-05-27 DIAGNOSIS — R5383 Other fatigue: Secondary | ICD-10-CM

## 2017-05-27 DIAGNOSIS — M5030 Other cervical disc degeneration, unspecified cervical region: Secondary | ICD-10-CM

## 2017-05-27 DIAGNOSIS — H02723 Madarosis of right eye, unspecified eyelid and periocular area: Secondary | ICD-10-CM

## 2017-05-27 DIAGNOSIS — N912 Amenorrhea, unspecified: Secondary | ICD-10-CM

## 2017-05-27 DIAGNOSIS — H5789 Other specified disorders of eye and adnexa: Secondary | ICD-10-CM

## 2017-05-27 LAB — C. TRACHOMATIS/N. GONORRHOEAE RNA
C. trachomatis RNA, TMA: NOT DETECTED
N. gonorrhoeae RNA, TMA: NOT DETECTED

## 2017-05-27 LAB — WET PREP FOR TRICH, YEAST, CLUE
MICRO NUMBER:: 81449609
Specimen Quality: ADEQUATE

## 2017-05-27 MED ORDER — METRONIDAZOLE 500 MG PO TABS: 500.0000 mg | ORAL_TABLET | Freq: Two times a day (BID) | ORAL | 0 refills | Status: DC

## 2017-05-27 MED ORDER — MELOXICAM 15 MG PO TABS
15.0000 mg | ORAL_TABLET | Freq: Every day | ORAL | 1 refills | Status: DC | PRN
Start: 2017-05-27 — End: 2017-07-19

## 2017-05-27 NOTE — Telephone Encounter (Signed)
Called patient and relayed information to her.

## 2017-05-27 NOTE — Telephone Encounter (Signed)
-----   Message from Donella Stade, [place] sent at 05/27/2017  8:39 AM EST ----- Call pt: she does have bacterial vaginosis(overgrowth of normal bacteria) cause of discharge. I will send over metronidazole for 7 days. No GC/Chlamydia.

## 2017-05-28 ENCOUNTER — Encounter: Payer: Self-pay | Admitting: Physician Assistant

## 2017-05-28 DIAGNOSIS — R5383 Other fatigue: Secondary | ICD-10-CM | POA: Diagnosis not present

## 2017-05-28 DIAGNOSIS — H5789 Other specified disorders of eye and adnexa: Secondary | ICD-10-CM | POA: Diagnosis not present

## 2017-05-28 DIAGNOSIS — D259 Leiomyoma of uterus, unspecified: Secondary | ICD-10-CM | POA: Insufficient documentation

## 2017-05-28 DIAGNOSIS — N912 Amenorrhea, unspecified: Secondary | ICD-10-CM | POA: Diagnosis not present

## 2017-05-28 DIAGNOSIS — N920 Excessive and frequent menstruation with regular cycle: Secondary | ICD-10-CM | POA: Diagnosis not present

## 2017-05-28 MED ORDER — MELOXICAM 15 MG PO TABS: 15.0000 mg | ORAL_TABLET | Freq: Every day | ORAL | 0 refills | Status: DC | PRN

## 2017-05-31 ENCOUNTER — Other Ambulatory Visit: Payer: Self-pay | Admitting: Physician Assistant

## 2017-05-31 DIAGNOSIS — D259 Leiomyoma of uterus, unspecified: Secondary | ICD-10-CM

## 2017-05-31 DIAGNOSIS — N950 Postmenopausal bleeding: Secondary | ICD-10-CM

## 2017-05-31 DIAGNOSIS — R9389 Abnormal findings on diagnostic imaging of other specified body structures: Secondary | ICD-10-CM

## 2017-05-31 LAB — COMPREHENSIVE METABOLIC PANEL
AG RATIO: 1.6 (calc) (ref 1.0–2.5)
ALT: 10 U/L (ref 6–29)
AST: 16 U/L (ref 10–35)
Albumin: 4.2 g/dL (ref 3.6–5.1)
Alkaline phosphatase (APISO): 61 U/L (ref 33–115)
BUN: 19 mg/dL (ref 7–25)
CHLORIDE: 105 mmol/L (ref 98–110)
CO2: 28 mmol/L (ref 20–32)
CREATININE: 1.03 mg/dL (ref 0.50–1.10)
Calcium: 9.4 mg/dL (ref 8.6–10.2)
GLOBULIN: 2.7 g/dL (ref 1.9–3.7)
GLUCOSE: 90 mg/dL (ref 65–99)
Potassium: 4.1 mmol/L (ref 3.5–5.3)
SODIUM: 142 mmol/L (ref 135–146)
TOTAL PROTEIN: 6.9 g/dL (ref 6.1–8.1)
Total Bilirubin: 0.8 mg/dL (ref 0.2–1.2)

## 2017-05-31 LAB — CBC
HCT: 42.2 % (ref 35.0–45.0)
HEMOGLOBIN: 13.8 g/dL (ref 11.7–15.5)
MCH: 26.2 pg — ABNORMAL LOW (ref 27.0–33.0)
MCHC: 32.7 g/dL (ref 32.0–36.0)
MCV: 80.1 fL (ref 80.0–100.0)
MPV: 11.1 fL (ref 7.5–12.5)
Platelets: 277 10*3/uL (ref 140–400)
RBC: 5.27 10*6/uL — AB (ref 3.80–5.10)
RDW: 14.7 % (ref 11.0–15.0)
WBC: 5.5 10*3/uL (ref 3.8–10.8)

## 2017-05-31 LAB — C-REACTIVE PROTEIN: CRP: 1.3 mg/L (ref <8.0)

## 2017-05-31 LAB — VITAMIN B12: VITAMIN B 12: 1037 pg/mL (ref 200–1100)

## 2017-05-31 LAB — FERRITIN: FERRITIN: 41 ng/mL (ref 10–232)

## 2017-05-31 LAB — FOLLICLE STIMULATING HORMONE: FSH: 89.5 m[IU]/mL

## 2017-05-31 LAB — TSH: TSH: 0.79 mIU/L

## 2017-05-31 LAB — ANA: ANA: NEGATIVE

## 2017-05-31 LAB — VITAMIN D 25 HYDROXY (VIT D DEFICIENCY, FRACTURES): Vit D, 25-Hydroxy: 26 ng/mL — ABNORMAL LOW (ref 30–100)

## 2017-05-31 LAB — SEDIMENTATION RATE: Sed Rate: 11 mm/h (ref 0–20)

## 2017-06-21 ENCOUNTER — Ambulatory Visit (INDEPENDENT_AMBULATORY_CARE_PROVIDER_SITE_OTHER): Payer: BLUE CROSS/BLUE SHIELD | Admitting: Obstetrics & Gynecology

## 2017-06-21 ENCOUNTER — Encounter: Payer: Self-pay | Admitting: Obstetrics & Gynecology

## 2017-06-21 VITALS — BP 123/82 | HR 89 | Ht 68.0 in | Wt 221.0 lb

## 2017-06-21 DIAGNOSIS — Z124 Encounter for screening for malignant neoplasm of cervix: Secondary | ICD-10-CM

## 2017-06-21 DIAGNOSIS — N950 Postmenopausal bleeding: Secondary | ICD-10-CM

## 2017-06-21 DIAGNOSIS — Z1151 Encounter for screening for human papillomavirus (HPV): Secondary | ICD-10-CM

## 2017-06-21 DIAGNOSIS — Z01411 Encounter for gynecological examination (general) (routine) with abnormal findings: Secondary | ICD-10-CM | POA: Diagnosis not present

## 2017-06-21 DIAGNOSIS — D219 Benign neoplasm of connective and other soft tissue, unspecified: Secondary | ICD-10-CM | POA: Diagnosis not present

## 2017-06-21 DIAGNOSIS — R9389 Abnormal findings on diagnostic imaging of other specified body structures: Secondary | ICD-10-CM | POA: Diagnosis not present

## 2017-06-21 DIAGNOSIS — Z01419 Encounter for gynecological examination (general) (routine) without abnormal findings: Secondary | ICD-10-CM

## 2017-06-21 MED ORDER — MISOPROSTOL 200 MCG PO TABS: ORAL_TABLET | ORAL | 0 refills | Status: DC

## 2017-06-21 NOTE — Addendum Note (Signed)
Addended by: Emily Filbert on: 06/21/2017 02:11 PM   Modules accepted: Orders

## 2017-06-21 NOTE — Progress Notes (Signed)
Subjective:    Jill Webb is a 52 y.o. separated P0 female who presents for an annual exam. She was referred by [facility] for PMB with fibroids.  The patient is not currently sexually active. GYN screening history: last pap: was normal. The patient wears seatbelts: yes. The patient participates in regular exercise: no. Has the patient ever been transfused or tattooed?: no. The patient reports that there is not domestic violence in her life.   Menstrual History: OB History    Gravida Para Term Preterm AB Living   0 0 0 0 0 0   SAB TAB Ectopic Multiple Live Births   0 0 0 0 0      Menarche age: 10 No LMP recorded (lmp unknown). Patient is not currently having periods (Reason: Other).    The following portions of the patient's history were reviewed and updated as appropriate: allergies, current medications, past family history, past medical history, past social history, past surgical history and problem list.  Review of Systems Pertinent items are noted in HPI.   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 [place] 5/18 and negative   Objective:    BP 123/82   Pulse 89   Ht '5\' 8"'  (1.727 m)   Wt 221 lb (100.2 kg)   LMP  (LMP Unknown) Comment: Last period sometime in 2017  BMI 33.60 kg/m   General Appearance:    Alert, cooperative, no distress, appears stated age  Head:    Normocephalic, without obvious abnormality, atraumatic  Eyes:    PERRL, conjunctiva/corneas clear, EOM's intact, fundi    benign, both eyes  Ears:    Normal TM's and external ear canals, both ears  Nose:   Nares normal, septum midline, mucosa normal, no drainage    or sinus tenderness  Throat:   Lips, mucosa, and tongue normal; teeth and gums normal  Neck:   Supple, symmetrical, trachea midline, no adenopathy;    thyroid:  no enlargement/tenderness/nodules; no carotid   bruit or JVD   Back:     Symmetric, no curvature, ROM normal, no CVA tenderness  Lungs:     Clear to auscultation bilaterally, respirations unlabored  Chest Wall:    No tenderness or deformity   Heart:    Regular rate and rhythm, S1 and S2 normal, no murmur, rub   or gallop  Breast Exam:    No tenderness, masses, or nipple abnormality  Abdomen:     Soft, non-tender, bowel sounds active all four quadrants,    no masses, no organomegaly  Genitalia:    Normal female without lesion, discharge or tenderness, upper limit normal size and shape, anteverted, mobile, non-tender, normal adnexal exam      Extremities:   Extremities normal, atraumatic, no cyanosis or edema  Pulses:   2+ and symmetric all extremities  Skin:   Skin color, texture, turgor normal, no rashes or lesions  Lymph nodes:   Cervical, supraclavicular, and axillary nodes normal  Neurologic:   CNII-XII intact, normal strength, sensation and reflexes    throughout  .    Assessment:    Healthy female exam.   PMB   Plan:     Thin prep Pap smear. with cotesting Schedule EMBX after pretreat with cytotec

## 2017-06-23 LAB — CYTOLOGY - PAP
Adequacy: ABSENT
Diagnosis: NEGATIVE
HPV: NOT DETECTED

## 2017-06-30 DIAGNOSIS — H0288A Meibomian gland dysfunction right eye, upper and lower eyelids: Secondary | ICD-10-CM | POA: Diagnosis not present

## 2017-07-08 ENCOUNTER — Encounter (INDEPENDENT_AMBULATORY_CARE_PROVIDER_SITE_OTHER): Payer: Self-pay

## 2017-07-14 ENCOUNTER — Other Ambulatory Visit: Payer: BLUE CROSS/BLUE SHIELD | Admitting: Obstetrics & Gynecology

## 2017-07-19 ENCOUNTER — Ambulatory Visit (INDEPENDENT_AMBULATORY_CARE_PROVIDER_SITE_OTHER): Payer: BLUE CROSS/BLUE SHIELD | Admitting: Family Medicine

## 2017-07-19 ENCOUNTER — Encounter (INDEPENDENT_AMBULATORY_CARE_PROVIDER_SITE_OTHER): Payer: Self-pay | Admitting: Family Medicine

## 2017-07-19 VITALS — BP 112/75 | HR 90 | Temp 97.4°F | Ht 67.0 in | Wt 213.0 lb

## 2017-07-19 DIAGNOSIS — R0602 Shortness of breath: Secondary | ICD-10-CM | POA: Diagnosis not present

## 2017-07-19 DIAGNOSIS — E66811 Obesity, class 1: Secondary | ICD-10-CM

## 2017-07-19 DIAGNOSIS — Z1331 Encounter for screening for depression: Secondary | ICD-10-CM

## 2017-07-19 DIAGNOSIS — Z6833 Body mass index (BMI) 33.0-33.9, adult: Secondary | ICD-10-CM

## 2017-07-19 DIAGNOSIS — E559 Vitamin D deficiency, unspecified: Secondary | ICD-10-CM | POA: Diagnosis not present

## 2017-07-19 DIAGNOSIS — Z0289 Encounter for other administrative examinations: Secondary | ICD-10-CM

## 2017-07-19 DIAGNOSIS — E669 Obesity, unspecified: Secondary | ICD-10-CM

## 2017-07-19 DIAGNOSIS — Z9189 Other specified personal risk factors, not elsewhere classified: Secondary | ICD-10-CM | POA: Diagnosis not present

## 2017-07-19 DIAGNOSIS — R5383 Other fatigue: Secondary | ICD-10-CM | POA: Diagnosis not present

## 2017-07-19 NOTE — Progress Notes (Signed)
.  Office: [phone]  /  Fax: [phone]   HPI:   Chief Complaint: OBESITY  Jill Webb (MR# [id number]) is a 52 y.o. female who presents on 07/19/2017 for obesity evaluation and treatment. Current BMI is Body mass index is 33.36 kg/m.Jill Webb Jill Webb has struggled with obesity for years and has been unsuccessful in either losing weight or maintaining long term weight loss. Jill Webb attended our information session and states she is currently in the action stage of change and ready to dedicate time achieving and maintaining a healthier weight.   Jill Webb states her desired weight loss is 38 lbs she has been heavy most of  her life her heaviest weight ever was 230 lbs she has significant food cravings issues  she snacks frequently in the evenings she is frequently drinking liquids with calories she frequently makes poor food choices she struggles with emotional eating    Fatigue Gisselle feels her energy is lower than it should be. This has worsened with weight gain and has not worsened recently. Jill Webb admits to daytime somnolence and  admits to waking up still tired. Patient is at risk for obstructive sleep apnea. Patent has a history of symptoms of daytime fatigue. Patient generally gets 7 hours of sleep per night, and states they generally have generally restful sleep. Snoring is not present. Apneic episodes are not present. Epworth Sleepiness Score is 8  Dyspnea on exertion Jill Webb notes increasing shortness of breath with exercising and seems to be worsening over time with weight gain. She notes getting out of breath sooner with activity than she used to. She feels slightly more shortness of breath with exercise, but doesn't exercise much. This has not gotten worse recently. EKG shows some artifacts but otherwise within normal limits. Jill Webb denies orthopnea.  Vitamin D Deficiency Jill Webb has a diagnosis of vitamin D deficiency. She is on OTC supplement and denies nausea, vomiting  or muscle weakness.  At risk for osteopenia and osteoporosis Jill Webb is at higher risk of osteopenia and osteoporosis due to vitamin D deficiency.   Depression Screen Jill Webb Food and Mood (modified PHQ-9) score was  Depression screen PHQ 2/9 07/19/2017  Decreased Interest 1  Down, Depressed, Hopeless 1  PHQ - 2 Score 2  Altered sleeping 0  Tired, decreased energy 0  Change in appetite 1  Feeling bad or failure about yourself  0  Trouble concentrating 0  Moving slowly or fidgety/restless 0  Suicidal thoughts 0  PHQ-9 Score 3  Difficult doing work/chores Not difficult at all    ALLERGIES: Allergies  Allergen Reactions  . Amoxicillin-Pot Clavulanate   . Gluten Meal Other (See Comments)    Bloating and diarrhea  . Codeine Rash    MEDICATIONS: Current Outpatient Medications on File Prior to Visit  Medication Sig Dispense Refill  . ergocalciferol (VITAMIN D2) 50000 units capsule Take 1 capsule (50,000 Units total) by mouth once a week. 12 capsule 0  . escitalopram (LEXAPRO) 5 MG tablet Take 1 tablet (5 mg total) by mouth daily. 90 tablet 4  . LORazepam (ATIVAN) 0.5 MG tablet Take 1 tablet (0.5 mg total) by mouth every 8 (eight) hours as needed for anxiety. 30 tablet 0  . meloxicam (MOBIC) 15 MG tablet Take 1 tablet (15 mg total) by mouth daily as needed for pain. 90 tablet 0   No current facility-administered medications on file prior to visit.     PAST MEDICAL HISTORY: Past Medical History:  Diagnosis Date  . Anxiety   . Depression   .  Uterine fibroid   . Vitamin D deficiency     PAST SURGICAL HISTORY: Past Surgical History:  Procedure Laterality Date  . BUNIONECTOMY  2010  . ORTHOPEDIC SURGERY     2015  . UTERINE FIBROID SURGERY  2007, 2011    SOCIAL HISTORY: Social History   Tobacco Use  . Smoking status: Never Smoker  . Smokeless tobacco: Never Used  Substance Use Topics  . Alcohol use: Yes    Alcohol/week: 0.0 oz  . Drug use: No    FAMILY  HISTORY: Family History  Problem Relation Age of Onset  . Hypothyroidism Mother   . High Cholesterol Mother   . Cancer Sister        breast    ROS: Review of Systems  Constitutional: Positive for malaise/fatigue. Negative for weight loss.  Eyes:       Wear glasses or contacts  Respiratory: Positive for shortness of breath (with exertion).   Cardiovascular: Negative for orthopnea.       Leg cramping  Gastrointestinal: Negative for nausea and vomiting.  Musculoskeletal: Positive for back pain.       Negative muscle weakness  Skin:       Hair or nail changes  Psychiatric/Behavioral: Positive for depression. Negative for suicidal ideas.       + Stress    PHYSICAL EXAM: Blood pressure 112/75, pulse 90, temperature (!) 97.4 F (36.3 C), temperature source Oral, height 5\' 7"  (1.702 m), weight 213 lb (96.6 kg), SpO2 99 %. Body mass index is 33.36 kg/m. Physical Exam  Constitutional: She is oriented to person, place, and time. She appears well-developed and well-nourished.  HENT:  Head: Normocephalic and atraumatic.  Nose: Nose normal.  Eyes: EOM are normal. No scleral icterus.  Neck: Normal range of motion. Neck supple. No thyromegaly present.  Cardiovascular: Normal rate and regular rhythm.  Pulmonary/Chest: Effort normal. No respiratory distress.  Abdominal: Soft. There is no tenderness.  + Obesity  Musculoskeletal:  Range of Motion normal in all 4 extremities Trace edema noted in bilateral lower extremities  Neurological: She is alert and oriented to person, place, and time. Coordination normal.  Skin: Skin is warm and dry.  Psychiatric: She has a normal mood and affect. Her behavior is normal.  Vitals reviewed.   RECENT LABS AND TESTS: BMET    Component Value Date/Time   NA 142 05/28/2017 1057   K 4.1 05/28/2017 1057   CL 105 05/28/2017 1057   CO2 28 05/28/2017 1057   GLUCOSE 90 05/28/2017 1057   BUN 19 05/28/2017 1057   CREATININE 1.03 05/28/2017 1057    CALCIUM 9.4 05/28/2017 1057   GFRNONAA 70 05/08/2016 0907   GFRAA 81 05/08/2016 0907   No results found for: HGBA1C No results found for: INSULIN CBC    Component Value Date/Time   WBC 5.5 05/28/2017 1057   RBC 5.27 (H) 05/28/2017 1057   HGB 13.8 05/28/2017 1057   HCT 42.2 05/28/2017 1057   PLT 277 05/28/2017 1057   MCV 80.1 05/28/2017 1057   MCH 26.2 (L) 05/28/2017 1057   MCHC 32.7 05/28/2017 1057   RDW 14.7 05/28/2017 1057   LYMPHSABS 1,827 05/08/2016 0907   MONOABS 378 05/08/2016 0907   EOSABS 63 05/08/2016 0907   BASOSABS 63 05/08/2016 0907   Iron/TIBC/Ferritin/ %Sat    Component Value Date/Time   IRON 113 05/08/2016 0907   TIBC 348 05/08/2016 0907   FERRITIN 41 05/28/2017 1057   IRONPCTSAT 32 05/08/2016 0907   Lipid  Panel     Component Value Date/Time   CHOL 246 (H) 09/30/2016 0956   TRIG 89 09/30/2016 0956   HDL 84 09/30/2016 0956   CHOLHDL 2.9 09/30/2016 0956   VLDL 18 09/30/2016 0956   LDLCALC 144 (H) 09/30/2016 0956   Hepatic Function Panel     Component Value Date/Time   PROT 6.9 05/28/2017 1057   ALBUMIN 4.7 05/08/2016 0907   AST 16 05/28/2017 1057   ALT 10 05/28/2017 1057   ALKPHOS 45 05/08/2016 0907   BILITOT 0.8 05/28/2017 1057      Component Value Date/Time   TSH 0.79 05/28/2017 1057   Vitamin D No recent labs  ECG  shows NSR with a rate of 77 BPM INDIRECT CALORIMETER done today shows a VO2 of 200 and a REE of 1394. Her calculated basal metabolic rate is 8527 thus her basal metabolic rate is worse than expected.    ASSESSMENT AND PLAN: Other fatigue - Plan: EKG 12-Lead, Vitamin B12, CBC With Differential, Comprehensive metabolic panel, Folate, Hemoglobin A1c, Insulin, random, Lipid Panel With LDL/HDL Ratio, T3, T4, free, TSH  Shortness of breath on exertion - Plan: CBC With Differential  Vitamin D deficiency - Plan: VITAMIN D 25 Hydroxy (Vit-D Deficiency, Fractures)  Depression screening  At risk for osteoporosis  Class 1  obesity with serious comorbidity and body mass index (BMI) of 33.0 to 33.9 in adult, unspecified obesity type  PLAN:  Fatigue Britten was informed that her fatigue may be related to obesity, depression or many other causes. Labs will be ordered, and in the meanwhile Gemini has agreed to work on diet, exercise and weight loss to help with fatigue. Proper sleep hygiene was discussed including the need for 7-8 hours of quality sleep each night. A sleep study was not ordered based on symptoms and Epworth score.  Dyspnea on exertion Geovana's shortness of breath appears to be obesity related and exercise induced. She has agreed to work on weight loss and gradually increase exercise to treat her exercise induced shortness of breath. If Magdalena follows our instructions and loses weight without improvement of her shortness of breath, we will plan to refer to pulmonology. We will monitor this condition regularly. Dyasia agrees to this plan.  Vitamin D Deficiency Sharnetta was informed that low vitamin D levels contributes to fatigue and are associated with obesity, breast, and colon cancer. Terrye agrees to continue OTC supplement and will follow up for routine testing of vitamin D, at least 2-3 times per year. She was informed of the risk of over-replacement of vitamin D and agrees to not increase her dose unless she discusses this with [place] first. We will check Vit D level today and Quaneshia agrees to follow up with our clinic in 2 weeks.  At risk for osteopenia and osteoporosis Livian is at risk for osteopenia and osteoporsis due to her vitamin D deficiency. She was encouraged to take her vitamin D and follow her higher calcium diet and increase strengthening exercise to help strengthen her bones and decrease her risk of osteopenia and osteoporosis.  Depression Screen Bennette had a negative depression screening. Depression is commonly associated with obesity and often results in emotional eating  behaviors. We will monitor this closely and work on CBT to help improve the non-hunger eating patterns. Referral to Psychology may be required if no improvement is seen as she continues in our clinic.  Obesity Kecia is currently in the action stage of change and her goal is to continue with  weight loss efforts She has agreed to follow the Category 2 plan with only 1 snack Misty has been instructed to work up to a goal of 150 minutes of combined cardio and strengthening exercise per week for weight loss and overall health benefits. We discussed the following Behavioral Modification Strategies today: increasing lean protein intake, work on meal planning and easy cooking plans and ways to avoid night time snacking  Kallista has agreed to follow up with our clinic in 2 weeks. She was informed of the importance of frequent follow up visits to maximize her success with intensive lifestyle modifications for her multiple health conditions. She was informed we would discuss her lab results at her next visit unless there is a critical issue that needs to be addressed sooner. Amna agreed to keep her next visit at the agreed upon time to discuss these results.    OBESITY BEHAVIORAL INTERVENTION VISIT  Today's visit was # 1 out of 22.  Starting weight: 213 lbs Starting date: 07/19/17 Today's weight : 213 lbs  Today's date: 07/19/2017 Total lbs lost to date: 0 (Patients must lose 7 lbs in the first 6 months to continue with counseling)   ASK: We discussed the diagnosis of obesity with [name] today and Nira Conn agreed to give [place] permission to discuss obesity behavioral modification therapy today.  ASSESS: Rebecka has the diagnosis of obesity and her BMI today is 33.35 Takirah is in the action stage of change   ADVISE: Destane was educated on the multiple health risks of obesity as well as the benefit of weight loss to improve her health. She was advised of the need for long term  treatment and the importance of lifestyle modifications.  AGREE: Multiple dietary modification options and treatment options were discussed and  Geanette agreed to the above obesity treatment plan.   I, Trixie Dredge, am acting as transcriptionist for Ilene Qua, MD   I have reviewed the above documentation for accuracy and completeness, and I agree with the above. - Ilene Qua, MD

## 2017-07-20 LAB — FOLATE: Folate: 9.7 ng/mL (ref >3.0)

## 2017-07-20 LAB — CBC WITH DIFFERENTIAL
BASOS ABS: 0.1 10*3/uL (ref 0.0–0.2)
Basos: 1 %
EOS (ABSOLUTE): 0.1 10*3/uL (ref 0.0–0.4)
Eos: 1 %
Hematocrit: 42.5 % (ref 34.0–46.6)
Hemoglobin: 14 g/dL (ref 11.1–15.9)
Immature Grans (Abs): 0 10*3/uL (ref 0.0–0.1)
Immature Granulocytes: 0 %
LYMPHS ABS: 2 10*3/uL (ref 0.7–3.1)
Lymphs: 28 %
MCH: 26.1 pg — AB (ref 26.6–33.0)
MCHC: 32.9 g/dL (ref 31.5–35.7)
MCV: 79 fL (ref 79–97)
MONOCYTES: 5 %
Monocytes Absolute: 0.4 10*3/uL (ref 0.1–0.9)
NEUTROS ABS: 4.6 10*3/uL (ref 1.4–7.0)
Neutrophils: 65 %
RBC: 5.37 x10E6/uL — ABNORMAL HIGH (ref 3.77–5.28)
RDW: 16.3 % — ABNORMAL HIGH (ref 12.3–15.4)
WBC: 7.2 10*3/uL (ref 3.4–10.8)

## 2017-07-20 LAB — LIPID PANEL WITH LDL/HDL RATIO
Cholesterol, Total: 242 mg/dL — ABNORMAL HIGH (ref 100–199)
HDL: 70 mg/dL (ref >39)
LDL CALC: 151 mg/dL — AB (ref 0–99)
LDL/HDL RATIO: 2.2 ratio (ref 0.0–3.2)
Triglycerides: 104 mg/dL (ref 0–149)
VLDL CHOLESTEROL CAL: 21 mg/dL (ref 5–40)

## 2017-07-20 LAB — COMPREHENSIVE METABOLIC PANEL
ALBUMIN: 4.6 g/dL (ref 3.5–5.5)
ALK PHOS: 76 IU/L (ref 39–117)
ALT: 14 IU/L (ref 0–32)
AST: 17 IU/L (ref 0–40)
Albumin/Globulin Ratio: 1.6 (ref 1.2–2.2)
BILIRUBIN TOTAL: 0.4 mg/dL (ref 0.0–1.2)
BUN / CREAT RATIO: 16 (ref 9–23)
BUN: 15 mg/dL (ref 6–24)
CHLORIDE: 99 mmol/L (ref 96–106)
CO2: 26 mmol/L (ref 20–29)
Calcium: 9.5 mg/dL (ref 8.7–10.2)
Creatinine, Ser: 0.96 mg/dL (ref 0.57–1.00)
GFR calc Af Amer: 80 mL/min/{1.73_m2} (ref >59)
GFR calc non Af Amer: 70 mL/min/{1.73_m2} (ref >59)
GLUCOSE: 89 mg/dL (ref 65–99)
Globulin, Total: 2.8 g/dL (ref 1.5–4.5)
POTASSIUM: 4.5 mmol/L (ref 3.5–5.2)
SODIUM: 139 mmol/L (ref 134–144)
Total Protein: 7.4 g/dL (ref 6.0–8.5)

## 2017-07-20 LAB — INSULIN, RANDOM: INSULIN: 8 u[IU]/mL (ref 2.6–24.9)

## 2017-07-20 LAB — T4, FREE: FREE T4: 0.98 ng/dL (ref 0.82–1.77)

## 2017-07-20 LAB — HEMOGLOBIN A1C
Est. average glucose Bld gHb Est-mCnc: 80 mg/dL
Hgb A1c MFr Bld: 4.4 % — ABNORMAL LOW (ref 4.8–5.6)

## 2017-07-20 LAB — VITAMIN B12: Vitamin B-12: 768 pg/mL (ref 232–1245)

## 2017-07-20 LAB — TSH: TSH: 0.993 u[IU]/mL (ref 0.450–4.500)

## 2017-07-20 LAB — VITAMIN D 25 HYDROXY (VIT D DEFICIENCY, FRACTURES): Vit D, 25-Hydroxy: 32.5 ng/mL (ref 30.0–100.0)

## 2017-07-20 LAB — T3: T3 TOTAL: 106 ng/dL (ref 71–180)

## 2017-07-21 ENCOUNTER — Ambulatory Visit (INDEPENDENT_AMBULATORY_CARE_PROVIDER_SITE_OTHER): Payer: BLUE CROSS/BLUE SHIELD | Admitting: Obstetrics & Gynecology

## 2017-07-21 ENCOUNTER — Encounter: Payer: Self-pay | Admitting: Obstetrics & Gynecology

## 2017-07-21 VITALS — BP 116/76 | HR 84 | Wt 217.0 lb

## 2017-07-21 DIAGNOSIS — Z3202 Encounter for pregnancy test, result negative: Secondary | ICD-10-CM | POA: Diagnosis not present

## 2017-07-21 DIAGNOSIS — N950 Postmenopausal bleeding: Secondary | ICD-10-CM

## 2017-07-21 DIAGNOSIS — Z01812 Encounter for preprocedural laboratory examination: Secondary | ICD-10-CM

## 2017-07-21 LAB — POCT URINE PREGNANCY: PREG TEST UR: NEGATIVE

## 2017-07-21 NOTE — Progress Notes (Signed)
Patient ID: Jill Webb, female   DOB: 08-20-1965, 52 y.o.   MRN: [id number]  No chief complaint on file.   HPI Jill Webb is a 53 y.o. female.    HPI She is here for a EMBX due to PMB. This occurred in 12/18. She went through menopause in 2017. She took cytotec last night. Past Medical History:  Diagnosis Date  . Anxiety   . Depression   . Uterine fibroid   . Vitamin D deficiency     Past Surgical History:  Procedure Laterality Date  . BUNIONECTOMY  2010  . ORTHOPEDIC SURGERY     2015  . UTERINE FIBROID SURGERY  2007, 2011    Family History  Problem Relation Age of Onset  . Hypothyroidism Mother   . High Cholesterol Mother   . Cancer Sister        breast    Social History Social History   Tobacco Use  . Smoking status: Never Smoker  . Smokeless tobacco: Never Used  Substance Use Topics  . Alcohol use: Yes    Alcohol/week: 0.0 oz  . Drug use: No    Allergies  Allergen Reactions  . Amoxicillin-Pot Clavulanate   . Gluten Meal Other (See Comments)    Bloating and diarrhea  . Codeine Rash    Current Outpatient Medications  Medication Sig Dispense Refill  . ergocalciferol (VITAMIN D2) 50000 units capsule Take 1 capsule (50,000 Units total) by mouth once a week. 12 capsule 0  . escitalopram (LEXAPRO) 5 MG tablet Take 1 tablet (5 mg total) by mouth daily. 90 tablet 4  . LORazepam (ATIVAN) 0.5 MG tablet Take 1 tablet (0.5 mg total) by mouth every 8 (eight) hours as needed for anxiety. 30 tablet 0  . meloxicam (MOBIC) 15 MG tablet Take 1 tablet (15 mg total) by mouth daily as needed for pain. 90 tablet 0   No current facility-administered medications for this visit.     Review of Systems Review of Systems  There were no vitals taken for this visit.  Physical Exam Physical Exam Breathing, conversing, and ambulating normally Well nourished, well hydrated Black female, no apparent distress  UPT negative, consent signed, time out done Cervix  prepped with betadine and grasped with a single tooth tenaculum Uterus sounded to 8 cm Pipelle used for 2 passes with a moderate amount of tissue obtained. A gritty sensation was appreciated. She tolerated the procedure well.  Data Reviewed  MD) Dx:  Well woman exam with routine gynecolo...  Component 32mo ago  Adequacy Satisfactory for evaluation endocervical/transformation zone component ABSENT.   Diagnosis NEGATIVE FOR INTRAEPITHELIAL LESIONS OR MALIGNANCY.   HPV NOT DETECTED   Comment: Normal Reference Range - NOT Detected        Assessment    PMB    Plan    Check gyn ultrasound Await pathology results       Emily Filbert 07/21/2017, 10:49 AM

## 2017-07-23 ENCOUNTER — Ambulatory Visit (INDEPENDENT_AMBULATORY_CARE_PROVIDER_SITE_OTHER): Payer: BLUE CROSS/BLUE SHIELD

## 2017-07-23 DIAGNOSIS — D252 Subserosal leiomyoma of uterus: Secondary | ICD-10-CM

## 2017-07-23 DIAGNOSIS — N950 Postmenopausal bleeding: Secondary | ICD-10-CM

## 2017-07-26 ENCOUNTER — Telehealth: Payer: Self-pay | Admitting: *Deleted

## 2017-07-26 NOTE — Telephone Encounter (Signed)
Pt notified of benign endometrial bx.  She will monitor the bleeding and call if it should continue.

## 2017-08-02 ENCOUNTER — Ambulatory Visit (INDEPENDENT_AMBULATORY_CARE_PROVIDER_SITE_OTHER): Payer: BLUE CROSS/BLUE SHIELD | Admitting: Family Medicine

## 2017-08-04 ENCOUNTER — Other Ambulatory Visit: Payer: Self-pay | Admitting: Physician Assistant

## 2017-08-04 ENCOUNTER — Ambulatory Visit (INDEPENDENT_AMBULATORY_CARE_PROVIDER_SITE_OTHER): Payer: BLUE CROSS/BLUE SHIELD | Admitting: Family Medicine

## 2017-08-04 VITALS — BP 113/71 | HR 69 | Temp 97.9°F | Ht 67.0 in | Wt 212.0 lb

## 2017-08-04 DIAGNOSIS — E559 Vitamin D deficiency, unspecified: Secondary | ICD-10-CM

## 2017-08-04 DIAGNOSIS — Z9189 Other specified personal risk factors, not elsewhere classified: Secondary | ICD-10-CM | POA: Diagnosis not present

## 2017-08-04 DIAGNOSIS — E669 Obesity, unspecified: Secondary | ICD-10-CM

## 2017-08-04 DIAGNOSIS — Z6833 Body mass index (BMI) 33.0-33.9, adult: Secondary | ICD-10-CM | POA: Diagnosis not present

## 2017-08-04 DIAGNOSIS — E8881 Metabolic syndrome: Secondary | ICD-10-CM

## 2017-08-04 MED ORDER — ERGOCALCIFEROL 1.25 MG (50000 UT) PO CAPS: 50000.0000 [IU] | ORAL_CAPSULE | ORAL | 0 refills | Status: DC

## 2017-08-04 NOTE — Progress Notes (Signed)
Office: [phone]  /  Fax: [phone]   HPI:   Chief Complaint: OBESITY Jill Webb is here to discuss her progress with her obesity treatment plan. She is on the Category 2 plan with only 1 snack and is following her eating plan approximately 100 % of the time. She states she is exercising 0 minutes 0 times per week. [name] did 45 calorie slice of bread instead of gluten free bread and had no GI upset. She is doing microwave meal for lunch. She didn't have much hunger for the past 2 weeks, craved pickles on sandwich.  Her weight is 212 lb (96.2 kg) today and has had a weight loss of 1 pound over a period of 2 weeks since her last visit. She has lost 1 lbs since starting treatment with [place].  Vitamin D Deficiency [name] has a diagnosis of vitamin D deficiency. She was on 50,000 Vit D previously but does not have any refills. She denies nausea, vomiting or muscle weakness.  At risk for osteopenia and osteoporosis [name] is at higher risk of osteopenia and osteoporosis due to vitamin D deficiency.   Insulin Resistance [name] has a diagnosis of insulin resistance based on her elevated fasting insulin level >5. Insulin of 8.0, occasional excess hunger but she denies hypoglycemia. Although [name]'s blood glucose readings are still under good control, insulin resistance puts her at greater risk of metabolic syndrome and diabetes. She is not taking metformin currently and continues to work on diet and exercise to decrease risk of diabetes.  ALLERGIES: Allergies  Allergen Reactions  . Amoxicillin-Pot Clavulanate   . Gluten Meal Other (See Comments)    Bloating and diarrhea  . Codeine Rash    MEDICATIONS: Current Outpatient Medications on File Prior to Visit  Medication Sig Dispense Refill  . escitalopram (LEXAPRO) 5 MG tablet Take 1 tablet (5 mg total) by mouth daily. 90 tablet 4  . LORazepam (ATIVAN) 0.5 MG tablet Take 1 tablet (0.5 mg total) by mouth every 8 (eight) hours as needed  for anxiety. 30 tablet 0  . meloxicam (MOBIC) 15 MG tablet Take 1 tablet (15 mg total) by mouth daily as needed for pain. 90 tablet 0   No current facility-administered medications on file prior to visit.     PAST MEDICAL HISTORY: Past Medical History:  Diagnosis Date  . Anxiety   . Depression   . Uterine fibroid   . Vitamin D deficiency     PAST SURGICAL HISTORY: Past Surgical History:  Procedure Laterality Date  . BUNIONECTOMY  2010  . ORTHOPEDIC SURGERY     2015  . UTERINE FIBROID SURGERY  2007, 2011    SOCIAL HISTORY: Social History   Tobacco Use  . Smoking status: Never Smoker  . Smokeless tobacco: Never Used  Substance Use Topics  . Alcohol use: Yes    Alcohol/week: 0.0 oz  . Drug use: No    FAMILY HISTORY: Family History  Problem Relation Age of Onset  . Hypothyroidism Mother   . High Cholesterol Mother   . Cancer Sister        breast    ROS: Review of Systems  Constitutional: Positive for weight loss.  Gastrointestinal: Negative for nausea and vomiting.  Musculoskeletal:       Negative muscle weakness  Endo/Heme/Allergies:       Negative hypoglycemia Positive polyphagia    PHYSICAL EXAM: Blood pressure 113/71, pulse 69, temperature 97.9 F (36.6 C), temperature source Oral, height 5\' 7"  (1.702 m), weight 212  lb (96.2 kg), SpO2 98 %. Body mass index is 33.2 kg/m. Physical Exam  Constitutional: She is oriented to person, place, and time. She appears well-developed and well-nourished.  Cardiovascular: Normal rate.  Pulmonary/Chest: Effort normal.  Musculoskeletal: Normal range of motion.  Neurological: She is oriented to person, place, and time.  Skin: Skin is warm and dry.  Psychiatric: She has a normal mood and affect. Her behavior is normal.  Vitals reviewed.   RECENT LABS AND TESTS: BMET    Component Value Date/Time   NA 139 07/19/2017 1114   K 4.5 07/19/2017 1114   CL 99 07/19/2017 1114   CO2 26 07/19/2017 1114   GLUCOSE 89  07/19/2017 1114   GLUCOSE 90 05/28/2017 1057   BUN 15 07/19/2017 1114   CREATININE 0.96 07/19/2017 1114   CREATININE 1.03 05/28/2017 1057   CALCIUM 9.5 07/19/2017 1114   GFRNONAA 70 07/19/2017 1114   GFRNONAA 70 05/08/2016 0907   GFRAA 80 07/19/2017 1114   GFRAA 81 05/08/2016 0907   Lab Results  Component Value Date   HGBA1C 4.4 (L) 07/19/2017   Lab Results  Component Value Date   INSULIN 8.0 07/19/2017   CBC    Component Value Date/Time   WBC 7.2 07/19/2017 1114   WBC 5.5 05/28/2017 1057   RBC 5.37 (H) 07/19/2017 1114   RBC 5.27 (H) 05/28/2017 1057   HGB 14.0 07/19/2017 1114   HCT 42.5 07/19/2017 1114   PLT 277 05/28/2017 1057   MCV 79 07/19/2017 1114   MCH 26.1 (L) 07/19/2017 1114   MCH 26.2 (L) 05/28/2017 1057   MCHC 32.9 07/19/2017 1114   MCHC 32.7 05/28/2017 1057   RDW 16.3 (H) 07/19/2017 1114   LYMPHSABS 2.0 07/19/2017 1114   MONOABS 378 05/08/2016 0907   EOSABS 0.1 07/19/2017 1114   BASOSABS 0.1 07/19/2017 1114   Iron/TIBC/Ferritin/ %Sat    Component Value Date/Time   IRON 113 05/08/2016 0907   TIBC 348 05/08/2016 0907   FERRITIN 41 05/28/2017 1057   IRONPCTSAT 32 05/08/2016 0907   Lipid Panel     Component Value Date/Time   CHOL 242 (H) 07/19/2017 1114   TRIG 104 07/19/2017 1114   HDL 70 07/19/2017 1114   CHOLHDL 2.9 09/30/2016 0956   VLDL 18 09/30/2016 0956   LDLCALC 151 (H) 07/19/2017 1114   Hepatic Function Panel     Component Value Date/Time   PROT 7.4 07/19/2017 1114   ALBUMIN 4.6 07/19/2017 1114   AST 17 07/19/2017 1114   ALT 14 07/19/2017 1114   ALKPHOS 76 07/19/2017 1114   BILITOT 0.4 07/19/2017 1114      Component Value Date/Time   TSH 0.993 07/19/2017 1114   TSH 0.79 05/28/2017 1057   TSH 1.70 09/30/2016 0956  Results for RAVINDER, HOFLAND (MRN [id number]) as of 08/04/2017 15:28  Ref. Range 07/19/2017 11:14  Vitamin D, 25-Hydroxy Latest Ref Range: 30.0 - 100.0 ng/mL 32.5    ASSESSMENT AND PLAN: Vitamin D insufficiency -  Plan: ergocalciferol (VITAMIN D2) 50000 units capsule  Insulin resistance  At risk for osteoporosis  Class 1 obesity with serious comorbidity and body mass index (BMI) of 33.0 to 33.9 in adult, unspecified obesity type  PLAN:  Vitamin D Deficiency Jill Webb was informed that low vitamin D levels contributes to fatigue and are associated with obesity, breast, and colon cancer. Sofija agrees to continue taking prescription Vit D @50 ,000 IU every week #4 and we will refill for 1 month. She will follow up  for routine testing of vitamin D, at least 2-3 times per year. She was informed of the risk of over-replacement of vitamin D and agrees to not increase her dose unless she discusses this with [place] first. We will recheck labs in 3 months and Karalee agrees to follow up with our clinic in 2 weeks.  At risk for osteopenia and osteoporosis Shemicka is at risk for osteopenia and osteoporsis due to her vitamin D deficiency. She was encouraged to take her vitamin D and follow her higher calcium diet and increase strengthening exercise to help strengthen her bones and decrease her risk of osteopenia and osteoporosis.  Insulin Resistance Malloree will continue to work on weight loss, exercise, and decreasing simple carbohydrates in her diet to help decrease the risk of diabetes. We dicussed metformin including benefits and risks. She was informed that eating too many simple carbohydrates or too many calories at one sitting increases the likelihood of GI side effects. Chasmine declined metformin for now and prescription was not written today. She will continue category 2 meal plan. We will recheck labs in 3 months and Karess agrees to follow up with our clinic in 2 weeks as directed to monitor her progress.  Obesity Jariyah is currently in the action stage of change. As such, her goal is to continue with weight loss efforts She has agreed to follow the Category 2 plan Lucrecia has been instructed to work up to a  goal of 150 minutes of combined cardio and strengthening exercise per week for weight loss and overall health benefits. We discussed the following Behavioral Modification Strategies today: increasing lean protein intake, decreasing simple carbohydrates, and planning for success    Daya has agreed to follow up with our clinic in 2 weeks. She was informed of the importance of frequent follow up visits to maximize her success with intensive lifestyle modifications for her multiple health conditions.   OBESITY BEHAVIORAL INTERVENTION VISIT  Today's visit was # 2 out of 22.  Starting weight: 213 lbs Starting date: 07/19/17 Today's weight : 212 lbs Today's date: 08/04/2017 Total lbs lost to date: 1 (Patients must lose 7 lbs in the first 6 months to continue with counseling)   ASK: We discussed the diagnosis of obesity with [name] today and Nira Conn agreed to give [place] permission to discuss obesity behavioral modification therapy today.  ASSESS: Mashanda has the diagnosis of obesity and her BMI today is 33.2 Kiele is in the action stage of change   ADVISE: Lexine was educated on the multiple health risks of obesity as well as the benefit of weight loss to improve her health. She was advised of the need for long term treatment and the importance of lifestyle modifications.  AGREE: Multiple dietary modification options and treatment options were discussed and  Heleena agreed to the above obesity treatment plan.  I, Trixie Dredge, am acting as transcriptionist for Ilene Qua, MD  I have reviewed the above documentation for accuracy and completeness, and I agree with the above. - Ilene Qua, MD

## 2017-08-18 ENCOUNTER — Ambulatory Visit (INDEPENDENT_AMBULATORY_CARE_PROVIDER_SITE_OTHER): Payer: BLUE CROSS/BLUE SHIELD | Admitting: Family Medicine

## 2017-08-18 VITALS — BP 99/66 | HR 83 | Temp 98.2°F | Ht 67.0 in | Wt 210.0 lb

## 2017-08-18 DIAGNOSIS — Z6832 Body mass index (BMI) 32.0-32.9, adult: Secondary | ICD-10-CM | POA: Diagnosis not present

## 2017-08-18 DIAGNOSIS — E669 Obesity, unspecified: Secondary | ICD-10-CM | POA: Diagnosis not present

## 2017-08-18 DIAGNOSIS — E559 Vitamin D deficiency, unspecified: Secondary | ICD-10-CM

## 2017-08-18 NOTE — Progress Notes (Signed)
Office: [phone]  /  Fax: [phone]   HPI:   Chief Complaint: OBESITY Jill Webb is here to discuss her progress with her obesity treatment plan. She is on the Category 2 plan and is following her eating plan approximately 80 % of the time. She states she is exercising 0 minutes 0 times per week. Jill Webb states a few times during the last 2 weeks she would skip dinner or eat lunch portion of food for dinner.  Her weight is 210 lb (95.3 kg) today and has had a weight loss of 2 pounds over a period of 2 weeks since her last visit. She has lost 3 lbs since starting treatment with [place].  Vitamin D Deficiency [name] has a diagnosis of vitamin D deficiency. She is currently taking prescription Vit D and denies nausea, vomiting or muscle weakness.  ALLERGIES: Allergies  Allergen Reactions  . Amoxicillin-Pot Clavulanate   . Gluten Meal Other (See Comments)    Bloating and diarrhea  . Codeine Rash    MEDICATIONS: Current Outpatient Medications on File Prior to Visit  Medication Sig Dispense Refill  . ergocalciferol (VITAMIN D2) 50000 units capsule Take 1 capsule (50,000 Units total) by mouth once a week. 4 capsule 0  . escitalopram (LEXAPRO) 5 MG tablet Take 1 tablet (5 mg total) by mouth daily. 90 tablet 4  . LORazepam (ATIVAN) 0.5 MG tablet Take 1 tablet (0.5 mg total) by mouth every 8 (eight) hours as needed for anxiety. 30 tablet 0  . meloxicam (MOBIC) 15 MG tablet Take 1 tablet (15 mg total) by mouth daily as needed for pain. 90 tablet 0   No current facility-administered medications on file prior to visit.     PAST MEDICAL HISTORY: Past Medical History:  Diagnosis Date  . Anxiety   . Depression   . Uterine fibroid   . Vitamin D deficiency     PAST SURGICAL HISTORY: Past Surgical History:  Procedure Laterality Date  . BUNIONECTOMY  2010  . ORTHOPEDIC SURGERY     2015  . UTERINE FIBROID SURGERY  2007, 2011    SOCIAL HISTORY: Social History   Tobacco Use  .  Smoking status: Never Smoker  . Smokeless tobacco: Never Used  Substance Use Topics  . Alcohol use: Yes    Alcohol/week: 0.0 oz  . Drug use: No    FAMILY HISTORY: Family History  Problem Relation Age of Onset  . Hypothyroidism Mother   . High Cholesterol Mother   . Cancer Sister        breast    ROS: Review of Systems  Constitutional: Positive for weight loss.  Gastrointestinal: Negative for nausea and vomiting.  Musculoskeletal:       Negative muscle weakness    PHYSICAL EXAM: Blood pressure 99/66, pulse 83, temperature 98.2 F (36.8 C), temperature source Oral, height 5\' 7"  (1.702 m), weight 210 lb (95.3 kg), SpO2 97 %. Body mass index is 32.89 kg/m. Physical Exam  Constitutional: She is oriented to person, place, and time. She appears well-developed and well-nourished.  Cardiovascular: Normal rate.  Pulmonary/Chest: Effort normal.  Musculoskeletal: Normal range of motion.  Neurological: She is oriented to person, place, and time.  Skin: Skin is warm and dry.  Psychiatric: She has a normal mood and affect. Her behavior is normal.  Vitals reviewed.   RECENT LABS AND TESTS: BMET    Component Value Date/Time   NA 139 07/19/2017 1114   K 4.5 07/19/2017 1114   CL 99 07/19/2017 1114  CO2 26 07/19/2017 1114   GLUCOSE 89 07/19/2017 1114   GLUCOSE 90 05/28/2017 1057   BUN 15 07/19/2017 1114   CREATININE 0.96 07/19/2017 1114   CREATININE 1.03 05/28/2017 1057   CALCIUM 9.5 07/19/2017 1114   GFRNONAA 70 07/19/2017 1114   GFRNONAA 70 05/08/2016 0907   GFRAA 80 07/19/2017 1114   GFRAA 81 05/08/2016 0907   Lab Results  Component Value Date   HGBA1C 4.4 (L) 07/19/2017   Lab Results  Component Value Date   INSULIN 8.0 07/19/2017   CBC    Component Value Date/Time   WBC 7.2 07/19/2017 1114   WBC 5.5 05/28/2017 1057   RBC 5.37 (H) 07/19/2017 1114   RBC 5.27 (H) 05/28/2017 1057   HGB 14.0 07/19/2017 1114   HCT 42.5 07/19/2017 1114   PLT 277 05/28/2017 1057     MCV 79 07/19/2017 1114   MCH 26.1 (L) 07/19/2017 1114   MCH 26.2 (L) 05/28/2017 1057   MCHC 32.9 07/19/2017 1114   MCHC 32.7 05/28/2017 1057   RDW 16.3 (H) 07/19/2017 1114   LYMPHSABS 2.0 07/19/2017 1114   MONOABS 378 05/08/2016 0907   EOSABS 0.1 07/19/2017 1114   BASOSABS 0.1 07/19/2017 1114   Iron/TIBC/Ferritin/ %Sat    Component Value Date/Time   IRON 113 05/08/2016 0907   TIBC 348 05/08/2016 0907   FERRITIN 41 05/28/2017 1057   IRONPCTSAT 32 05/08/2016 0907   Lipid Panel     Component Value Date/Time   CHOL 242 (H) 07/19/2017 1114   TRIG 104 07/19/2017 1114   HDL 70 07/19/2017 1114   CHOLHDL 2.9 09/30/2016 0956   VLDL 18 09/30/2016 0956   LDLCALC 151 (H) 07/19/2017 1114   Hepatic Function Panel     Component Value Date/Time   PROT 7.4 07/19/2017 1114   ALBUMIN 4.6 07/19/2017 1114   AST 17 07/19/2017 1114   ALT 14 07/19/2017 1114   ALKPHOS 76 07/19/2017 1114   BILITOT 0.4 07/19/2017 1114      Component Value Date/Time   TSH 0.993 07/19/2017 1114   TSH 0.79 05/28/2017 1057   TSH 1.70 09/30/2016 0956  Results for Jill, Webb (MRN [id number]) as of 08/18/2017 09:48  Ref. Range 07/19/2017 11:14  Vitamin D, 25-Hydroxy Latest Ref Range: 30.0 - 100.0 ng/mL 32.5    ASSESSMENT AND PLAN: Vitamin D deficiency  Class 1 obesity without serious comorbidity with body mass index (BMI) of 32.0 to 32.9 in adult, unspecified obesity type  PLAN:  Vitamin D Deficiency Alantra was informed that low vitamin D levels contributes to fatigue and are associated with obesity, breast, and colon cancer. Lidiya agrees to continue taking prescription Vit D @50 ,000 IU every week #4 and will follow up for routine testing of vitamin D, at least 2-3 times per year. She was informed of the risk of over-replacement of vitamin D and agrees to not increase her dose unless she discusses this with [place] first. Quinesha agrees to follow up with our clinic in 2 weeks.  We spent > than 50% of  the 15 minute visit on the counseling as documented in the note.  Obesity [name] is currently in the action stage of change. As such, her goal is to continue with weight loss efforts She has agreed to follow the Category 2 plan [name] has been instructed to work up to a goal of 150 minutes of combined cardio and strengthening exercise per week for weight loss and overall health benefits. We discussed the following Behavioral  Modification Stratagies today: increasing lean protein intake, work on meal planning and easy cooking plans, no skipping meals, and planning for success   Albie has agreed to follow up with our clinic in 2 weeks. She was informed of the importance of frequent follow up visits to maximize her success with intensive lifestyle modifications for her multiple health conditions.   OBESITY BEHAVIORAL INTERVENTION VISIT  Today's visit was # 3 out of 22.  Starting weight: 213 lbs Starting date: 07/19/17 Today's weight : 210 lbs  Today's date: 08/18/2017 Total lbs lost to date: 3 (Patients must lose 7 lbs in the first 6 months to continue with counseling)   ASK: We discussed the diagnosis of obesity with [name] today and Bertha agreed to give [place] permission to discuss obesity behavioral modification therapy today.  ASSESS: Trinidi has the diagnosis of obesity and her BMI today is 32.88 Latecia is in the action stage of change   ADVISE: Parilee was educated on the multiple health risks of obesity as well as the benefit of weight loss to improve her health. She was advised of the need for long term treatment and the importance of lifestyle modifications.  AGREE: Multiple dietary modification options and treatment options were discussed and  Kenda agreed to the above obesity treatment plan.  I, Trixie Dredge, am acting as transcriptionist for Ilene Qua, MD  I have reviewed the above documentation for accuracy and completeness, and I agree with  the above. - Ilene Qua, MD

## 2017-09-02 ENCOUNTER — Ambulatory Visit (INDEPENDENT_AMBULATORY_CARE_PROVIDER_SITE_OTHER): Payer: BLUE CROSS/BLUE SHIELD | Admitting: Family Medicine

## 2017-09-02 VITALS — BP 113/73 | HR 72 | Temp 98.1°F | Ht 67.0 in | Wt 208.0 lb

## 2017-09-02 DIAGNOSIS — Z9189 Other specified personal risk factors, not elsewhere classified: Secondary | ICD-10-CM

## 2017-09-02 DIAGNOSIS — E559 Vitamin D deficiency, unspecified: Secondary | ICD-10-CM | POA: Diagnosis not present

## 2017-09-02 DIAGNOSIS — E669 Obesity, unspecified: Secondary | ICD-10-CM

## 2017-09-02 DIAGNOSIS — F3289 Other specified depressive episodes: Secondary | ICD-10-CM

## 2017-09-02 DIAGNOSIS — Z6832 Body mass index (BMI) 32.0-32.9, adult: Secondary | ICD-10-CM | POA: Diagnosis not present

## 2017-09-02 MED ORDER — ERGOCALCIFEROL 1.25 MG (50000 UT) PO CAPS: 50000.0000 [IU] | ORAL_CAPSULE | ORAL | 0 refills | Status: DC

## 2017-09-02 NOTE — Progress Notes (Signed)
Office: [phone]  /  Fax: [phone]   HPI:   Chief Complaint: OBESITY Jill Webb is here to discuss her progress with her obesity treatment plan. She is on the Category 2 plan and is following her eating plan approximately 70 % of the time. She states she is exercising 0 minutes 0 times per week. Jill Webb's life is sometimes getting hectic and dinner plans not always easy to follow through with. Looking for more options at dinner.  Her weight is 208 lb (94.3 kg) today and has had a weight loss of 2 pounds over a period of 2 weeks since her last visit. She has lost 5 lbs since starting treatment with [place].  Vitamin D Deficiency Jill Webb has a diagnosis of vitamin D deficiency. She is currently taking prescription Vit D and denies nausea, vomiting or muscle weakness.  At risk for osteopenia and osteoporosis Jill Webb is at higher risk of osteopenia and osteoporosis due to vitamin D deficiency.   Depression with emotional eating behaviors Jill Webb's depression is managed by her primary care physician. Jill Webb struggles with emotional eating and using food for comfort to the extent that it is negatively impacting her health. She often snacks when she is not hungry. Jill Webb sometimes feels she is out of control and then feels guilty that she made poor food choices. She has been working on behavior modification techniques to help reduce her emotional eating and has been somewhat successful. She shows no sign of suicidal or homicidal ideations.  Depression screen [facility] 2/9 07/19/2017 05/26/2017 01/20/2017 09/30/2016 06/05/2016  Decreased Interest 1 0 0 0 0  Down, Depressed, Hopeless 1 0 1 0 0  PHQ - 2 Score 2 0 1 0 0  Altered sleeping 0 - - - 0  Tired, decreased energy 0 - - - 1  Change in appetite 1 - - - 2  Feeling bad or failure about yourself  0 - - - 0  Trouble concentrating 0 - - - 0  Moving slowly or fidgety/restless 0 - - - 0  Suicidal thoughts 0 - - - 0  PHQ-9 Score 3 - - - 3  Difficult  doing work/chores Not difficult at all - - - -   ALLERGIES: Allergies  Allergen Reactions  . Amoxicillin-Pot Clavulanate   . Gluten Meal Other (See Comments)    Bloating and diarrhea  . Codeine Rash    MEDICATIONS: Current Outpatient Medications on File Prior to Visit  Medication Sig Dispense Refill  . escitalopram (LEXAPRO) 5 MG tablet Take 1 tablet (5 mg total) by mouth daily. 90 tablet 4  . LORazepam (ATIVAN) 0.5 MG tablet Take 1 tablet (0.5 mg total) by mouth every 8 (eight) hours as needed for anxiety. 30 tablet 0  . meloxicam (MOBIC) 15 MG tablet Take 1 tablet (15 mg total) by mouth daily as needed for pain. 90 tablet 0   No current facility-administered medications on file prior to visit.     PAST MEDICAL HISTORY: Past Medical History:  Diagnosis Date  . Anxiety   . Depression   . Uterine fibroid   . Vitamin D deficiency     PAST SURGICAL HISTORY: Past Surgical History:  Procedure Laterality Date  . BUNIONECTOMY  2010  . ORTHOPEDIC SURGERY     2015  . UTERINE FIBROID SURGERY  2007, 2011    SOCIAL HISTORY: Social History   Tobacco Use  . Smoking status: Never Smoker  . Smokeless tobacco: Never Used  Substance Use Topics  . Alcohol  use: Yes    Alcohol/week: 0.0 oz  . Drug use: No    FAMILY HISTORY: Family History  Problem Relation Age of Onset  . Hypothyroidism Mother   . High Cholesterol Mother   . Cancer Sister        breast    ROS: Review of Systems  Constitutional: Positive for weight loss.  Gastrointestinal: Negative for nausea and vomiting.  Musculoskeletal:       Negative muscle weakness  Psychiatric/Behavioral: Positive for depression. Negative for suicidal ideas.    PHYSICAL EXAM: Blood pressure 113/73, pulse 72, temperature 98.1 F (36.7 C), temperature source Oral, height 5\' 7"  (1.702 m), weight 208 lb (94.3 kg), SpO2 99 %. Body mass index is 32.58 kg/m. Physical Exam  Constitutional: She is oriented to person, place, and  time. She appears well-developed and well-nourished.  Cardiovascular: Normal rate.  Pulmonary/Chest: Effort normal.  Musculoskeletal: Normal range of motion.  Neurological: She is oriented to person, place, and time.  Skin: Skin is warm and dry.  Psychiatric: She has a normal mood and affect. Her behavior is normal.  Vitals reviewed.   RECENT LABS AND TESTS: BMET    Component Value Date/Time   NA 139 07/19/2017 1114   K 4.5 07/19/2017 1114   CL 99 07/19/2017 1114   CO2 26 07/19/2017 1114   GLUCOSE 89 07/19/2017 1114   GLUCOSE 90 05/28/2017 1057   BUN 15 07/19/2017 1114   CREATININE 0.96 07/19/2017 1114   CREATININE 1.03 05/28/2017 1057   CALCIUM 9.5 07/19/2017 1114   GFRNONAA 70 07/19/2017 1114   GFRNONAA 70 05/08/2016 0907   GFRAA 80 07/19/2017 1114   GFRAA 81 05/08/2016 0907   Lab Results  Component Value Date   HGBA1C 4.4 (L) 07/19/2017   Lab Results  Component Value Date   INSULIN 8.0 07/19/2017   CBC    Component Value Date/Time   WBC 7.2 07/19/2017 1114   WBC 5.5 05/28/2017 1057   RBC 5.37 (H) 07/19/2017 1114   RBC 5.27 (H) 05/28/2017 1057   HGB 14.0 07/19/2017 1114   HCT 42.5 07/19/2017 1114   PLT 277 05/28/2017 1057   MCV 79 07/19/2017 1114   MCH 26.1 (L) 07/19/2017 1114   MCH 26.2 (L) 05/28/2017 1057   MCHC 32.9 07/19/2017 1114   MCHC 32.7 05/28/2017 1057   RDW 16.3 (H) 07/19/2017 1114   LYMPHSABS 2.0 07/19/2017 1114   MONOABS 378 05/08/2016 0907   EOSABS 0.1 07/19/2017 1114   BASOSABS 0.1 07/19/2017 1114   Iron/TIBC/Ferritin/ %Sat    Component Value Date/Time   IRON 113 05/08/2016 0907   TIBC 348 05/08/2016 0907   FERRITIN 41 05/28/2017 1057   IRONPCTSAT 32 05/08/2016 0907   Lipid Panel     Component Value Date/Time   CHOL 242 (H) 07/19/2017 1114   TRIG 104 07/19/2017 1114   HDL 70 07/19/2017 1114   CHOLHDL 2.9 09/30/2016 0956   VLDL 18 09/30/2016 0956   LDLCALC 151 (H) 07/19/2017 1114   Hepatic Function Panel     Component Value  Date/Time   PROT 7.4 07/19/2017 1114   ALBUMIN 4.6 07/19/2017 1114   AST 17 07/19/2017 1114   ALT 14 07/19/2017 1114   ALKPHOS 76 07/19/2017 1114   BILITOT 0.4 07/19/2017 1114      Component Value Date/Time   TSH 0.993 07/19/2017 1114   TSH 0.79 05/28/2017 1057   TSH 1.70 09/30/2016 0956  Results for JASIME, WESTERGREN (MRN [id number]) as of 09/02/2017  12:25  Ref. Range 07/19/2017 11:14  Vitamin D, 25-Hydroxy Latest Ref Range: 30.0 - 100.0 ng/mL 32.5    ASSESSMENT AND PLAN: Vitamin D deficiency  Other depression - with emotional eating  At risk for osteoporosis  Class 1 obesity without serious comorbidity with body mass index (BMI) of 32.0 to 32.9 in adult, unspecified obesity type  Vitamin D insufficiency - Plan: ergocalciferol (VITAMIN D2) 50000 units capsule  PLAN:  Vitamin D Deficiency Hessie was informed that low vitamin D levels contributes to fatigue and are associated with obesity, breast, and colon cancer. Arielys agrees to continue taking prescription Vit D @50 ,000 IU every week #4 and we will refill for 1 month. She will follow up for routine testing of vitamin D, at least 2-3 times per year. She was informed of the risk of over-replacement of vitamin D and agrees to not increase her dose unless she discusses this with [place] first. Mariangela agrees to follow up with our clinic in 2 weeks.  At risk for osteopenia and osteoporosis Kenedy is at risk for osteopenia and osteoporsis due to her vitamin D deficiency. She was encouraged to take her vitamin D and follow her higher calcium diet and increase strengthening exercise to help strengthen her bones and decrease her risk of osteopenia and osteoporosis.  Depression with Emotional Eating Behaviors We discussed behavior modification techniques today to help Neveen deal with her emotional eating and depression. Dore agrees to continue taking Lexapro and Ativan as prescribed and she agrees to follow up with our clinic in 2  weeks.  Obesity Maysie is currently in the action stage of change. As such, her goal is to continue with weight loss efforts She has agreed to keep a food journal with 400-500 calories and 35 grams of protein at supper daily and follow the Category 2 plan Tessa has been instructed to work up to a goal of 150 minutes of combined cardio and strengthening exercise per week for weight loss and overall health benefits. We discussed the following Behavioral Modification Strategies today: increasing lean protein intake, increasing vegetables, work on meal planning and easy cooking plans, increase H20 intake, planning for success, and keep a strict food journal   Thirza has agreed to follow up with our clinic in 2 weeks. She was informed of the importance of frequent follow up visits to maximize her success with intensive lifestyle modifications for her multiple health conditions.   OBESITY BEHAVIORAL INTERVENTION VISIT  Today's visit was # 4 out of 22.  Starting weight: 213 lbs Starting date: 07/19/17 Today's weight : 208 lbs  Today's date: 09/02/2017 Total lbs lost to date: 5 (Patients must lose 7 lbs in the first 6 months to continue with counseling)   ASK: We discussed the diagnosis of obesity with [name] today and Hatice agreed to give [place] permission to discuss obesity behavioral modification therapy today.  ASSESS: Quincee has the diagnosis of obesity and her BMI today is 32.57 Corliss is in the action stage of change   ADVISE: Totiana was educated on the multiple health risks of obesity as well as the benefit of weight loss to improve her health. She was advised of the need for long term treatment and the importance of lifestyle modifications.  AGREE: Multiple dietary modification options and treatment options were discussed and  Zaylah agreed to the above obesity treatment plan.  I, Trixie Dredge, am acting as transcriptionist for Ilene Qua, MD  I have  reviewed the above documentation for accuracy  and completeness, and I agree with the above. - Ilene Qua, MD

## 2017-09-16 ENCOUNTER — Ambulatory Visit (INDEPENDENT_AMBULATORY_CARE_PROVIDER_SITE_OTHER): Payer: BLUE CROSS/BLUE SHIELD | Admitting: Family Medicine

## 2017-09-16 VITALS — BP 112/72 | HR 87 | Temp 98.1°F | Ht 67.0 in | Wt 210.0 lb

## 2017-09-16 DIAGNOSIS — E669 Obesity, unspecified: Secondary | ICD-10-CM | POA: Diagnosis not present

## 2017-09-16 DIAGNOSIS — E559 Vitamin D deficiency, unspecified: Secondary | ICD-10-CM | POA: Diagnosis not present

## 2017-09-16 DIAGNOSIS — Z6833 Body mass index (BMI) 33.0-33.9, adult: Secondary | ICD-10-CM | POA: Diagnosis not present

## 2017-09-16 DIAGNOSIS — F3289 Other specified depressive episodes: Secondary | ICD-10-CM | POA: Diagnosis not present

## 2017-09-19 NOTE — Progress Notes (Signed)
Office: [phone]  /  Fax: [phone]   HPI:   Chief Complaint: OBESITY Jill Webb is here to discuss her progress with her obesity treatment plan. She is on the keep a food journal with 400-500 calories and 35 grams of protein at supper daily and follow the Category 2 plan and is following her eating plan approximately 70 % of the time. She states she is exercising 0 minutes 0 times per week. Jill Webb has felt very tired the past 1 and half week and has skipped dinner and may  Eat a snack if she falls asleep when she gets home.  Her weight is 210 lb (95.3 kg) today and has gained 2 pounds since her last visit. She has lost 3 lbs since starting treatment with [place].  Vitamin D Deficiency Jill Webb has a diagnosis of vitamin D deficiency. She is currently taking prescription Vit D. She notes fatigue and denies nausea, vomiting or muscle weakness.  Depression with emotional eating behaviors Jill Webb is struggling with emotional eating and using food for comfort to the extent that it is negatively impacting her health. She often snacks when she is not hungry. Jill Webb sometimes feels she is out of control and then feels guilty that she made poor food choices. She has been working on behavior modification techniques to help reduce her emotional eating and has been somewhat successful. She shows no sign of suicidal or homicidal ideations.  Depression screen Jill Webb 2/9 07/19/2017 05/26/2017 01/20/2017 09/30/2016 06/05/2016  Decreased Interest 1 0 0 0 0  Down, Depressed, Hopeless 1 0 1 0 0  PHQ - 2 Score 2 0 1 0 0  Altered sleeping 0 - - - 0  Tired, decreased energy 0 - - - 1  Change in appetite 1 - - - 2  Feeling bad or failure about yourself  0 - - - 0  Trouble concentrating 0 - - - 0  Moving slowly or fidgety/restless 0 - - - 0  Suicidal thoughts 0 - - - 0  PHQ-9 Score 3 - - - 3  Difficult doing work/chores Not difficult at all - - - -   ALLERGIES: Allergies  Allergen Reactions  . Amoxicillin-Pot  Clavulanate   . Gluten Meal Other (See Comments)    Bloating and diarrhea  . Codeine Rash    MEDICATIONS: Current Outpatient Medications on File Prior to Visit  Medication Sig Dispense Refill  . ergocalciferol (VITAMIN D2) 50000 units capsule Take 1 capsule (50,000 Units total) by mouth once a week. 4 capsule 0  . escitalopram (LEXAPRO) 5 MG tablet Take 1 tablet (5 mg total) by mouth daily. 90 tablet 4  . LORazepam (ATIVAN) 0.5 MG tablet Take 1 tablet (0.5 mg total) by mouth every 8 (eight) hours as needed for anxiety. 30 tablet 0  . meloxicam (MOBIC) 15 MG tablet Take 1 tablet (15 mg total) by mouth daily as needed for pain. 90 tablet 0   No current facility-administered medications on file prior to visit.     PAST MEDICAL HISTORY: Past Medical History:  Diagnosis Date  . Anxiety   . Depression   . Uterine fibroid   . Vitamin D deficiency     PAST SURGICAL HISTORY: Past Surgical History:  Procedure Laterality Date  . BUNIONECTOMY  2010  . ORTHOPEDIC SURGERY     2015  . UTERINE FIBROID SURGERY  2007, 2011    SOCIAL HISTORY: Social History   Tobacco Use  . Smoking status: Never Smoker  . Smokeless  tobacco: Never Used  Substance Use Topics  . Alcohol use: Yes    Alcohol/week: 0.0 oz  . Drug use: No    FAMILY HISTORY: Family History  Problem Relation Age of Onset  . Hypothyroidism Mother   . High Cholesterol Mother   . Cancer Sister        breast    ROS: Review of Systems  Constitutional: Positive for malaise/fatigue. Negative for weight loss.  Gastrointestinal: Negative for nausea and vomiting.  Musculoskeletal:       Negative muscle weakness  Psychiatric/Behavioral: Positive for depression. Negative for suicidal ideas.    PHYSICAL EXAM: Blood pressure 112/72, pulse 87, temperature 98.1 F (36.7 C), temperature source Oral, height 5\' 7"  (1.702 m), weight 210 lb (95.3 kg), SpO2 97 %. Body mass index is 32.89 kg/m. Physical Exam  Constitutional: She  is oriented to person, place, and time. She appears well-developed and well-nourished.  Cardiovascular: Normal rate.  Pulmonary/Chest: Effort normal.  Musculoskeletal: Normal range of motion.  Neurological: She is oriented to person, place, and time.  Skin: Skin is warm and dry.  Psychiatric: She has a normal mood and affect. Her behavior is normal.  Vitals reviewed.   RECENT LABS AND TESTS: BMET    Component Value Date/Time   NA 139 07/19/2017 1114   K 4.5 07/19/2017 1114   CL 99 07/19/2017 1114   CO2 26 07/19/2017 1114   GLUCOSE 89 07/19/2017 1114   GLUCOSE 90 05/28/2017 1057   BUN 15 07/19/2017 1114   CREATININE 0.96 07/19/2017 1114   CREATININE 1.03 05/28/2017 1057   CALCIUM 9.5 07/19/2017 1114   GFRNONAA 70 07/19/2017 1114   GFRNONAA 70 05/08/2016 0907   GFRAA 80 07/19/2017 1114   GFRAA 81 05/08/2016 0907   Lab Results  Component Value Date   HGBA1C 4.4 (L) 07/19/2017   Lab Results  Component Value Date   INSULIN 8.0 07/19/2017   CBC    Component Value Date/Time   WBC 7.2 07/19/2017 1114   WBC 5.5 05/28/2017 1057   RBC 5.37 (H) 07/19/2017 1114   RBC 5.27 (H) 05/28/2017 1057   HGB 14.0 07/19/2017 1114   HCT 42.5 07/19/2017 1114   PLT 277 05/28/2017 1057   MCV 79 07/19/2017 1114   MCH 26.1 (L) 07/19/2017 1114   MCH 26.2 (L) 05/28/2017 1057   MCHC 32.9 07/19/2017 1114   MCHC 32.7 05/28/2017 1057   RDW 16.3 (H) 07/19/2017 1114   LYMPHSABS 2.0 07/19/2017 1114   MONOABS 378 05/08/2016 0907   EOSABS 0.1 07/19/2017 1114   BASOSABS 0.1 07/19/2017 1114   Iron/TIBC/Ferritin/ %Sat    Component Value Date/Time   IRON 113 05/08/2016 0907   TIBC 348 05/08/2016 0907   FERRITIN 41 05/28/2017 1057   IRONPCTSAT 32 05/08/2016 0907   Lipid Panel     Component Value Date/Time   CHOL 242 (H) 07/19/2017 1114   TRIG 104 07/19/2017 1114   HDL 70 07/19/2017 1114   CHOLHDL 2.9 09/30/2016 0956   VLDL 18 09/30/2016 0956   LDLCALC 151 (H) 07/19/2017 1114   Hepatic  Function Panel     Component Value Date/Time   PROT 7.4 07/19/2017 1114   ALBUMIN 4.6 07/19/2017 1114   AST 17 07/19/2017 1114   ALT 14 07/19/2017 1114   ALKPHOS 76 07/19/2017 1114   BILITOT 0.4 07/19/2017 1114      Component Value Date/Time   TSH 0.993 07/19/2017 1114   TSH 0.79 05/28/2017 1057   TSH 1.70  09/30/2016 0956  Results for ARLY, SALMINEN (MRN [id number]) as of 09/19/2017 18:11  Ref. Range 07/19/2017 11:14  Vitamin D, 25-Hydroxy Latest Ref Range: 30.0 - 100.0 ng/mL 32.5    ASSESSMENT AND PLAN: Vitamin D deficiency  Other depression - with emotional eating   Class 1 obesity with serious comorbidity and body mass index (BMI) of 33.0 to 33.9 in adult, unspecified obesity type  PLAN:  Vitamin D Deficiency Antrice was informed that low vitamin D levels contributes to fatigue and are associated with obesity, breast, and colon cancer. Simone agrees to continue taking prescription Vit D @50 ,000 IU every week and will follow up for routine testing of vitamin D, at least 2-3 times per year. She was informed of the risk of over-replacement of vitamin D and agrees to not increase her dose unless she discusses this with [place] first. Andee agrees to follow up with our clinic in 2 weeks.  Depression with Emotional Eating Behaviors We discussed behavior modification techniques today to help Taila deal with her emotional eating and depression. Kienna agrees to start Lexapro 5 mg PO daily #30 with no refills and she agrees to follow up with our clinic in 2 weeks.  We spent > than 50% of the 15 minute visit on the counseling as documented in the note.  Obesity Avyn is currently in the action stage of change. As such, her goal is to continue with weight loss efforts She has agreed to follow the Category 2 plan Faduma has been instructed to work up to a goal of 150 minutes of combined cardio and strengthening exercise per week for weight loss and overall health benefits. We  discussed the following Behavioral Modification Strategies today: increasing lean protein intake, no skipping meals, keeping healthy foods in the home, and planning for success We will discuss resistance training at next visit.  Jalaine has agreed to follow up with our clinic in 2 weeks. She was informed of the importance of frequent follow up visits to maximize her success with intensive lifestyle modifications for her multiple health conditions.   OBESITY BEHAVIORAL INTERVENTION VISIT  Today's visit was # 5 out of 22.  Starting weight: 213 lbs Starting date: 07/19/17 Today's weight : 210 lbs  Today's date: 09/16/2017 Total lbs lost to date: 3 (Patients must lose 7 lbs in the first 6 months to continue with counseling)   ASK: We discussed the diagnosis of obesity with [name] today and Nira Conn agreed to give [place] permission to discuss obesity behavioral modification therapy today.  ASSESS: Izora has the diagnosis of obesity and her BMI today is 32.88 Sharion is in the action stage of change   ADVISE: Shivonne was educated on the multiple health risks of obesity as well as the benefit of weight loss to improve her health. She was advised of the need for long term treatment and the importance of lifestyle modifications.  AGREE: Multiple dietary modification options and treatment options were discussed and  Kimora agreed to the above obesity treatment plan.  I, Trixie Dredge, am acting as transcriptionist for Ilene Qua, MD  I have reviewed the above documentation for accuracy and completeness, and I agree with the above. - Ilene Qua, MD

## 2017-10-04 ENCOUNTER — Ambulatory Visit (INDEPENDENT_AMBULATORY_CARE_PROVIDER_SITE_OTHER): Payer: BLUE CROSS/BLUE SHIELD | Admitting: Family Medicine

## 2017-10-04 VITALS — BP 112/72 | HR 70 | Temp 97.9°F | Ht 67.0 in | Wt 208.0 lb

## 2017-10-04 DIAGNOSIS — E669 Obesity, unspecified: Secondary | ICD-10-CM

## 2017-10-04 DIAGNOSIS — Z9189 Other specified personal risk factors, not elsewhere classified: Secondary | ICD-10-CM

## 2017-10-04 DIAGNOSIS — F3289 Other specified depressive episodes: Secondary | ICD-10-CM

## 2017-10-04 DIAGNOSIS — E559 Vitamin D deficiency, unspecified: Secondary | ICD-10-CM

## 2017-10-04 DIAGNOSIS — Z6832 Body mass index (BMI) 32.0-32.9, adult: Secondary | ICD-10-CM

## 2017-10-04 MED ORDER — ERGOCALCIFEROL 1.25 MG (50000 UT) PO CAPS
50000.0000 [IU] | ORAL_CAPSULE | ORAL | 0 refills | Status: DC
Start: 2017-10-04 — End: 2017-10-20

## 2017-10-04 MED ORDER — ESCITALOPRAM OXALATE 5 MG PO TABS: 5.0000 mg | ORAL_TABLET | Freq: Every day | ORAL | 0 refills | Status: DC

## 2017-10-05 NOTE — Progress Notes (Signed)
Office: [phone]  /  Fax: [phone]   HPI:   Chief Complaint: OBESITY Jill Webb is here to discuss her progress with her obesity treatment plan. She is on the Category 2 plan and is following her Webb plan approximately 75 % of the time. She states she is exercising 0 minutes 0 times per week. [name] tried to be mindful of making sure she had enough for breakfast.  Her weight is 208 lb (94.3 kg) today and has had a weight loss of 2 pounds over a period of 2 to 3 weeks since her last visit. She has lost 5 lbs since starting treatment with [place].  Jill Webb has a diagnosis of Jill Webb deficiency. She is currently taking prescription Vit Webb and denies nausea, vomiting or muscle weakness.  Jill Webb is Jill higher risk of osteopenia and osteoporosis due to Jill Webb deficiency.   Jill Webb is struggling with emotional Webb and using food for comfort to the extent that it is negatively impacting her health. She often snacks when she is not hungry. Jill Webb Jill Webb. She has been working on behavior modification techniques to help reduce her emotional Webb and has been somewhat successful. She shows no sign of suicidal or homicidal ideations.  Jill screen Jill Webb 05/26/2017 01/20/2017 09/30/2016 06/05/2016  Decreased Interest 1 0 0 0 0  Down, Depressed, Hopeless 1 0 1 0 0  PHQ - 2 Score 2 0 1 0 0  Altered sleeping 0 - - - 0  Tired, decreased energy 0 - - - 1  Change in appetite 1 - - - 2  Feeling bad or failure about yourself  0 - - - 0  Trouble concentrating 0 - - - 0  Moving slowly or fidgety/restless 0 - - - 0  Suicidal thoughts 0 - - - 0  PHQ-9 Score 3 - - - 3  Difficult doing work/chores Not difficult Jill all - - - -    ALLERGIES: Allergies  Allergen Reactions  . Amoxicillin-Pot  Clavulanate   . Gluten Meal Other (See Comments)    Bloating and diarrhea  . Codeine Rash    MEDICATIONS: Current Outpatient Medications on File Prior to Visit  Medication Sig Dispense Refill  . LORazepam (ATIVAN) 0.5 MG tablet Take 1 tablet (0.5 mg total) by mouth every 8 (eight) hours as needed for anxiety. 30 tablet 0  . meloxicam (MOBIC) 15 MG tablet Take 1 tablet (15 mg total) by mouth daily as needed for pain. 90 tablet 0   No current facility-administered medications on file prior to visit.     PAST MEDICAL HISTORY: Past Medical History:  Diagnosis Date  . Anxiety   . Jill   . Uterine fibroid   . Jill Webb deficiency     PAST SURGICAL HISTORY: Past Surgical History:  Procedure Laterality Date  . BUNIONECTOMY  2010  . ORTHOPEDIC SURGERY     2015  . UTERINE FIBROID SURGERY  2007, 2011    SOCIAL HISTORY: Social History   Tobacco Use  . Smoking status: Never Smoker  . Smokeless tobacco: Never Used  Substance Use Topics  . Alcohol use: Yes    Alcohol/week: 0.0 oz  . Drug use: No    FAMILY HISTORY: Family History  Problem Relation Age of Onset  . Hypothyroidism Mother   .  High Cholesterol Mother   . Cancer Sister        breast    ROS: Review of Systems  Constitutional: Positive for weight loss.  Gastrointestinal: Negative for nausea and vomiting.  Musculoskeletal:       Negative muscle weakness  Psychiatric/Behavioral: Positive for Jill. Negative for suicidal ideas.    PHYSICAL EXAM: Blood pressure 112/72, pulse 70, temperature 97.9 F (36.6 C), temperature source Oral, height 5\' 7"  (1.702 m), weight 208 lb (94.3 kg), SpO2 98 %. Body mass index is 32.58 kg/m. Physical Exam  Constitutional: She is oriented to person, place, and time. She appears well-developed and well-nourished.  Cardiovascular: Normal rate.  Pulmonary/Chest: Effort normal.  Musculoskeletal: Normal range of motion.  Neurological: She is oriented to person, place,  and time.  Skin: Skin is warm and dry.  Psychiatric: She has a normal mood and affect. Her behavior is normal.  Vitals reviewed.   RECENT LABS AND TESTS: BMET    Component Value Date/Time   NA 139 07/19/2017 1114   K 4.5 07/19/2017 1114   CL 99 07/19/2017 1114   CO2 26 07/19/2017 1114   GLUCOSE 89 07/19/2017 1114   GLUCOSE 90 05/28/2017 1057   BUN 15 07/19/2017 1114   CREATININE 0.96 07/19/2017 1114   CREATININE 1.03 05/28/2017 1057   CALCIUM 9.5 07/19/2017 1114   GFRNONAA 70 07/19/2017 1114   GFRNONAA 70 05/08/2016 0907   GFRAA 80 07/19/2017 1114   GFRAA 81 05/08/2016 0907   Lab Results  Component Value Date   HGBA1C 4.4 (L) 07/19/2017   Lab Results  Component Value Date   INSULIN 8.0 07/19/2017   CBC    Component Value Date/Time   WBC 7.2 07/19/2017 1114   WBC 5.5 05/28/2017 1057   RBC 5.37 (H) 07/19/2017 1114   RBC 5.27 (H) 05/28/2017 1057   HGB 14.0 07/19/2017 1114   HCT 42.5 07/19/2017 1114   PLT 277 05/28/2017 1057   MCV 79 07/19/2017 1114   MCH 26.1 (L) 07/19/2017 1114   MCH 26.2 (L) 05/28/2017 1057   MCHC 32.9 07/19/2017 1114   MCHC 32.7 05/28/2017 1057   RDW 16.3 (H) 07/19/2017 1114   LYMPHSABS 2.0 07/19/2017 1114   MONOABS 378 05/08/2016 0907   EOSABS 0.1 07/19/2017 1114   BASOSABS 0.1 07/19/2017 1114   Iron/TIBC/Ferritin/ %Sat    Component Value Date/Time   IRON 113 05/08/2016 0907   TIBC 348 05/08/2016 0907   FERRITIN 41 05/28/2017 1057   IRONPCTSAT 32 05/08/2016 0907   Lipid Panel     Component Value Date/Time   CHOL 242 (H) 07/19/2017 1114   TRIG 104 07/19/2017 1114   HDL 70 07/19/2017 1114   CHOLHDL 2.9 09/30/2016 0956   VLDL 18 09/30/2016 0956   LDLCALC 151 (H) 07/19/2017 1114   Hepatic Function Panel     Component Value Date/Time   PROT 7.4 07/19/2017 1114   ALBUMIN 4.6 07/19/2017 1114   AST 17 07/19/2017 1114   ALT 14 07/19/2017 1114   ALKPHOS 76 07/19/2017 1114   BILITOT 0.4 07/19/2017 1114      Component Value  Date/Time   TSH 0.993 07/19/2017 1114   TSH 0.79 05/28/2017 1057   TSH 1.70 09/30/2016 0956  Results for Jill Webb (MRN [id number]) as of 10/05/2017 13:46  Ref. Range Webb 11:14  Jill Webb, 25-Hydroxy Latest Ref Range: 30.0 - 100.0 ng/mL 32.5    ASSESSMENT AND PLAN: Jill Webb deficiency - Plan: ergocalciferol (Jill D2) 50000  units capsule  Other Jill - with emotional Webb  - Plan: escitalopram (LEXAPRO) 5 MG tablet  Jill risk for osteoporosis  Class 1 obesity with serious comorbidity and body mass index (BMI) of 32.0 to 32.9 in adult, unspecified obesity type  PLAN:  Jill Webb Deficiency Jill Webb was informed that low Jill Webb levels contributes to fatigue and are associated with obesity, breast, and colon cancer. Zayonna agrees to continue taking prescription Vit Webb @50 ,000 IU every week #4 and we will refill for 1 month. She will follow up for routine testing of Jill Webb, Jill least 2-3 times per year. She was informed of the risk of over-replacement of Jill Webb and agrees to not increase her dose unless she discusses this with [place] first. Orvilla agrees to follow up with our clinic in 2 weeks.  Jill risk for osteopenia and osteoporosis [place] is Jill higher risk of osteopenia and osteoporosis due to Jill Webb deficiency.   Jill with Emotional Webb Behaviors We discussed behavior modification techniques today to help Neeti deal with her emotional Webb and Jill. Niaya agrees to continue taking Lexapro 5 mg PO daily and we will refill for 1 month. Leydy agrees to follow up with our clinic in 2 weeks.  Obesity Aianna is currently in the action stage of change. As such, her goal is to continue with weight loss efforts She has agreed to keep a food journal with 200-300 calories and 30 grams of protein Jill breakfast daily and follow the Category 2 plan Emalynn has been instructed to work up to a goal of 150 minutes of combined cardio and strengthening  exercise per week or resistance training 5-10 minutes 2 times per week for weight loss and overall health benefits. We discussed the following Behavioral Modification Strategies today: increasing lean protein intake, decreasing simple carbohydrates, increasing vegetables, work on meal planning and easy cooking plans, better snacking Webb, and planning for success   Lamont has agreed to follow up with our clinic in 2 weeks. She was informed of the importance of frequent follow up visits to maximize her success with intensive lifestyle modifications for her multiple health conditions.   OBESITY BEHAVIORAL INTERVENTION VISIT  Today's visit was # 6 out of 22.  Starting weight: 213 lbs Starting date: 07/19/17 Today's weight : 208 lbs Today's date: 10/04/2017 Total lbs lost to date: 5 (Patients must lose 7 lbs in the first 6 months to continue with counseling)   ASK: We discussed the diagnosis of obesity with [name] today and Mulki agreed to give [place] permission to discuss obesity behavioral modification therapy today.  ASSESS: Camreigh has the diagnosis of obesity and her BMI today is 32.57 Catalyna is in the action stage of change   ADVISE: Libi was educated on the multiple health risks of obesity as well as the benefit of weight loss to improve her health. She was advised of the need for long term treatment and the importance of lifestyle modifications.  AGREE: Multiple dietary modification options and treatment options were discussed and  Mariah agreed to the above obesity treatment plan.  I, Trixie Dredge, am acting as transcriptionist for Ilene Qua, MD  I have reviewed the above documentation for accuracy and completeness, and I agree with the above. - Ilene Qua, MD

## 2017-10-20 ENCOUNTER — Ambulatory Visit (INDEPENDENT_AMBULATORY_CARE_PROVIDER_SITE_OTHER): Payer: BLUE CROSS/BLUE SHIELD | Admitting: Family Medicine

## 2017-10-20 VITALS — BP 114/72 | HR 87 | Temp 98.1°F | Ht 67.0 in | Wt 208.0 lb

## 2017-10-20 DIAGNOSIS — E559 Vitamin D deficiency, unspecified: Secondary | ICD-10-CM

## 2017-10-20 DIAGNOSIS — F3289 Other specified depressive episodes: Secondary | ICD-10-CM

## 2017-10-20 DIAGNOSIS — E669 Obesity, unspecified: Secondary | ICD-10-CM | POA: Diagnosis not present

## 2017-10-20 DIAGNOSIS — Z9189 Other specified personal risk factors, not elsewhere classified: Secondary | ICD-10-CM

## 2017-10-20 DIAGNOSIS — Z6832 Body mass index (BMI) 32.0-32.9, adult: Secondary | ICD-10-CM

## 2017-10-20 MED ORDER — ERGOCALCIFEROL 1.25 MG (50000 UT) PO CAPS: 50000.0000 [IU] | ORAL_CAPSULE | ORAL | 0 refills | Status: DC

## 2017-10-20 MED ORDER — ESCITALOPRAM OXALATE 5 MG PO TABS: 5.0000 mg | ORAL_TABLET | Freq: Every day | ORAL | 0 refills | Status: DC

## 2017-10-20 NOTE — Progress Notes (Signed)
Office: [phone]  /  Fax: [phone]   HPI:   Chief Complaint: OBESITY Jill Webb is here to discuss her progress with her obesity treatment plan. She is on the keep a food journal with 200-300 calories and 30 grams of protein at breakfast daily and follow the Category 2 plan and is following her eating plan approximately 60-70 % of the time. She states she is doing resistance training for 5-7 minutes 3 times per week. Jill Webb is enjoying journaling for breakfast. Looking for ways to make meals more interesting.  Her weight is 208 lb (94.3 kg) today and has not lost weight since her last visit. She has lost 5 lbs since starting treatment with Jill Webb.  Vitamin D Deficiency Jill Webb has a diagnosis of vitamin D deficiency. She is currently taking prescription Vit D. She notes fatigue and denies nausea, vomiting or muscle weakness.  At risk for osteopenia and osteoporosis Jill Webb is at higher risk of osteopenia and osteoporosis due to vitamin D deficiency.   Depression with emotional eating behaviors Jill Webb is struggling with emotional eating and using food for comfort to the extent that it is negatively impacting her health. She often snacks when she is not hungry. Jill Webb sometimes feels she is out of control and then feels guilty that she made poor food choices. She has been working on behavior modification techniques to help reduce her emotional eating and has been somewhat successful. She shows no sign of suicidal or homicidal ideations.  Depression screen Jill Webb 2/9 07/19/2017 05/26/2017 01/20/2017 09/30/2016 06/05/2016  Decreased Interest 1 0 0 0 0  Down, Depressed, Hopeless 1 0 1 0 0  PHQ - 2 Score 2 0 1 0 0  Altered sleeping 0 - - - 0  Tired, decreased energy 0 - - - 1  Change in appetite 1 - - - 2  Feeling bad or failure about yourself  0 - - - 0  Trouble concentrating 0 - - - 0  Moving slowly or fidgety/restless 0 - - - 0  Suicidal thoughts 0 - - - 0  PHQ-9 Score 3 - - - 3  Difficult  doing work/chores Not difficult at all - - - -    ALLERGIES: Allergies  Allergen Reactions  . Amoxicillin-Pot Clavulanate   . Gluten Meal Other (See Comments)    Bloating and diarrhea  . Codeine Rash    MEDICATIONS: Current Outpatient Medications on File Prior to Visit  Medication Sig Dispense Refill  . LORazepam (ATIVAN) 0.5 MG tablet Take 1 tablet (0.5 mg total) by mouth every 8 (eight) hours as needed for anxiety. 30 tablet 0  . meloxicam (MOBIC) 15 MG tablet Take 1 tablet (15 mg total) by mouth daily as needed for pain. 90 tablet 0   No current facility-administered medications on file prior to visit.     PAST MEDICAL HISTORY: Past Medical History:  Diagnosis Date  . Anxiety   . Depression   . Uterine fibroid   . Vitamin D deficiency     PAST SURGICAL HISTORY: Past Surgical History:  Procedure Laterality Date  . BUNIONECTOMY  2010  . ORTHOPEDIC SURGERY     2015  . UTERINE FIBROID SURGERY  2007, 2011    SOCIAL HISTORY: Social History   Tobacco Use  . Smoking status: Never Smoker  . Smokeless tobacco: Never Used  Substance Use Topics  . Alcohol use: Yes    Alcohol/week: 0.0 oz  . Drug use: No    FAMILY HISTORY: Family History  Problem Relation Age of Onset  . Hypothyroidism Mother   . High Cholesterol Mother   . Cancer Sister        breast    ROS: Review of Systems  Constitutional: Positive for malaise/fatigue. Negative for weight loss.  Gastrointestinal: Negative for nausea and vomiting.  Musculoskeletal:       Negative muscle weakness  Psychiatric/Behavioral: Positive for depression. Negative for suicidal ideas.    PHYSICAL EXAM: Blood pressure 114/72, pulse 87, temperature 98.1 F (36.7 C), temperature source Oral, height 5\' 7"  (1.702 m), weight 208 lb (94.3 kg), SpO2 97 %. Body mass index is 32.58 kg/m. Physical Exam  Constitutional: She is oriented to person, place, and time. She appears well-developed and well-nourished.    Cardiovascular: Normal rate.  Pulmonary/Chest: Effort normal.  Musculoskeletal: Normal range of motion.  Neurological: She is oriented to person, place, and time.  Skin: Skin is warm and dry.  Psychiatric: She has a normal mood and affect. Her behavior is normal.  Vitals reviewed.   RECENT LABS AND TESTS: BMET    Component Value Date/Time   NA 139 07/19/2017 1114   K 4.5 07/19/2017 1114   CL 99 07/19/2017 1114   CO2 26 07/19/2017 1114   GLUCOSE 89 07/19/2017 1114   GLUCOSE 90 05/28/2017 1057   BUN 15 07/19/2017 1114   CREATININE 0.96 07/19/2017 1114   CREATININE 1.03 05/28/2017 1057   CALCIUM 9.5 07/19/2017 1114   GFRNONAA 70 07/19/2017 1114   GFRNONAA 70 05/08/2016 0907   GFRAA 80 07/19/2017 1114   GFRAA 81 05/08/2016 0907   Lab Results  Component Value Date   HGBA1C 4.4 (L) 07/19/2017   Lab Results  Component Value Date   INSULIN 8.0 07/19/2017   CBC    Component Value Date/Time   WBC 7.2 07/19/2017 1114   WBC 5.5 05/28/2017 1057   RBC 5.37 (H) 07/19/2017 1114   RBC 5.27 (H) 05/28/2017 1057   HGB 14.0 07/19/2017 1114   HCT 42.5 07/19/2017 1114   PLT 277 05/28/2017 1057   MCV 79 07/19/2017 1114   MCH 26.1 (L) 07/19/2017 1114   MCH 26.2 (L) 05/28/2017 1057   MCHC 32.9 07/19/2017 1114   MCHC 32.7 05/28/2017 1057   RDW 16.3 (H) 07/19/2017 1114   LYMPHSABS 2.0 07/19/2017 1114   MONOABS 378 05/08/2016 0907   EOSABS 0.1 07/19/2017 1114   BASOSABS 0.1 07/19/2017 1114   Iron/TIBC/Ferritin/ %Sat    Component Value Date/Time   IRON 113 05/08/2016 0907   TIBC 348 05/08/2016 0907   FERRITIN 41 05/28/2017 1057   IRONPCTSAT 32 05/08/2016 0907   Lipid Panel     Component Value Date/Time   CHOL 242 (H) 07/19/2017 1114   TRIG 104 07/19/2017 1114   HDL 70 07/19/2017 1114   CHOLHDL 2.9 09/30/2016 0956   VLDL 18 09/30/2016 0956   LDLCALC 151 (H) 07/19/2017 1114   Hepatic Function Panel     Component Value Date/Time   PROT 7.4 07/19/2017 1114   ALBUMIN 4.6  07/19/2017 1114   AST 17 07/19/2017 1114   ALT 14 07/19/2017 1114   ALKPHOS 76 07/19/2017 1114   BILITOT 0.4 07/19/2017 1114      Component Value Date/Time   TSH 0.993 07/19/2017 1114   TSH 0.79 05/28/2017 1057   TSH 1.70 09/30/2016 0956  Results for Jill Webb (MRN [id number]) as of 10/20/2017 11:17  Ref. Range 07/19/2017 11:14  Vitamin D, 25-Hydroxy Latest Ref Range: 30.0 - 100.0 ng/mL  32.5    ASSESSMENT AND PLAN: Vitamin D deficiency - Plan: ergocalciferol (VITAMIN D2) 50000 units capsule  Other depression - with emotional eating  - Plan: escitalopram (LEXAPRO) 5 MG tablet  At risk for osteoporosis  Class 1 obesity with serious comorbidity and body mass index (BMI) of 32.0 to 32.9 in adult, unspecified obesity type  PLAN:  Vitamin D Deficiency Camika was informed that low vitamin D levels contributes to fatigue and are associated with obesity, breast, and colon cancer. Carlyon agrees to continue taking prescription Vit D @50 ,000 IU every week #4 and we will refill for 1 month. She will follow up for routine testing of vitamin D, at least 2-3 times per year. She was informed of the risk of over-replacement of vitamin D and agrees to not increase her dose unless she discusses this with Jill Webb first. John agrees to follow up with our clinic in 2 to 3 weeks.  At risk for osteopenia and osteoporosis Bich is at risk for osteopenia and osteoporsis due to her vitamin D deficiency. She was encouraged to take her vitamin D and follow her higher calcium diet and increase strengthening exercise to help strengthen her bones and decrease her risk of osteopenia and osteoporosis.  Depression with Emotional Eating Behaviors We discussed behavior modification techniques today to help Jaylie deal with her emotional eating and depression. Shiane agrees to continue taking Lexapro 5 mg PO daily #30 and we will refill for 1 month.  Obesity Dema is currently in the action stage of  change. As such, her goal is to continue with weight loss efforts She has agreed to follow the Category 2 plan Tajae has been instructed to work up to a goal of 150 minutes of combined cardio and strengthening exercise per week for weight loss and overall health benefits. We discussed the following Behavioral Modification Strategies today: increasing lean protein intake, work on meal planning and easy cooking plans, better snacking choices, and planning for success   Aura has agreed to follow up with our clinic in 2 to 3 weeks. She was informed of the importance of frequent follow up visits to maximize her success with intensive lifestyle modifications for her multiple health conditions.   OBESITY BEHAVIORAL INTERVENTION VISIT  Today's visit was # 7 out of 22.  Starting weight: 213 lbs Starting date: 07/19/17 Today's weight : 208 lbs  Today's date: 10/20/2017 Total lbs lost to date: 5 (Patients must lose 7 lbs in the first 6 months to continue with counseling)   ASK: We discussed the diagnosis of obesity with [name] today and Juno agreed to give Jill Webb permission to discuss obesity behavioral modification therapy today.  ASSESS: Cassie has the diagnosis of obesity and her BMI today is 32.57 Tarri is in the action stage of change   ADVISE: Marc was educated on the multiple health risks of obesity as well as the benefit of weight loss to improve her health. She was advised of the need for long term treatment and the importance of lifestyle modifications.  AGREE: Multiple dietary modification options and treatment options were discussed and  Emylee agreed to the above obesity treatment plan.  I, Trixie Dredge, am acting as transcriptionist for Ilene Qua, MD  I have reviewed the above documentation for accuracy and completeness, and I agree with the above. - Ilene Qua, MD

## 2017-11-02 ENCOUNTER — Other Ambulatory Visit: Payer: Self-pay | Admitting: Physician Assistant

## 2017-11-02 DIAGNOSIS — Z1231 Encounter for screening mammogram for malignant neoplasm of breast: Secondary | ICD-10-CM

## 2017-11-03 ENCOUNTER — Ambulatory Visit (INDEPENDENT_AMBULATORY_CARE_PROVIDER_SITE_OTHER): Payer: BLUE CROSS/BLUE SHIELD

## 2017-11-03 DIAGNOSIS — Z1231 Encounter for screening mammogram for malignant neoplasm of breast: Secondary | ICD-10-CM | POA: Diagnosis not present

## 2017-11-04 NOTE — Progress Notes (Signed)
Call pt: normal mammogram. Follow up in 1 year.

## 2017-11-10 ENCOUNTER — Ambulatory Visit (INDEPENDENT_AMBULATORY_CARE_PROVIDER_SITE_OTHER): Payer: BLUE CROSS/BLUE SHIELD | Admitting: Physician Assistant

## 2017-11-10 VITALS — BP 114/76 | HR 69 | Temp 98.1°F | Ht 67.0 in | Wt 209.0 lb

## 2017-11-10 DIAGNOSIS — E559 Vitamin D deficiency, unspecified: Secondary | ICD-10-CM | POA: Diagnosis not present

## 2017-11-10 DIAGNOSIS — Z6832 Body mass index (BMI) 32.0-32.9, adult: Secondary | ICD-10-CM

## 2017-11-10 DIAGNOSIS — E669 Obesity, unspecified: Secondary | ICD-10-CM | POA: Diagnosis not present

## 2017-11-10 NOTE — Progress Notes (Signed)
Office: [phone]  /  Fax: [phone]   HPI:   Chief Complaint: OBESITY Jill Webb is here to discuss her progress with her obesity treatment plan. She is on the Category 2 plan and is following her eating plan approximately 80 % of the time. She states she is walking and using resistance bands for 60 minutes 3 times per week. Jill Webb continues to have challenges eating the required protein on the meal plan.  Her weight is 209 lb (94.8 kg) today and has gained 1 pound since her last visit. She has lost 4 lbs since starting treatment with [place].  Vitamin D Deficiency Jill Webb has a diagnosis of vitamin D deficiency. She is currently taking prescription Vit D and denies nausea, vomiting or muscle weakness.  ALLERGIES: Allergies  Allergen Reactions  . Amoxicillin-Pot Clavulanate   . Gluten Meal Other (See Comments)    Bloating and diarrhea  . Codeine Rash    MEDICATIONS: Current Outpatient Medications on File Prior to Visit  Medication Sig Dispense Refill  . ergocalciferol (VITAMIN D2) 50000 units capsule Take 1 capsule (50,000 Units total) by mouth once a week. 4 capsule 0  . escitalopram (LEXAPRO) 5 MG tablet Take 1 tablet (5 mg total) by mouth daily. 30 tablet 0  . LORazepam (ATIVAN) 0.5 MG tablet Take 1 tablet (0.5 mg total) by mouth every 8 (eight) hours as needed for anxiety. 30 tablet 0  . meloxicam (MOBIC) 15 MG tablet Take 1 tablet (15 mg total) by mouth daily as needed for pain. 90 tablet 0   No current facility-administered medications on file prior to visit.     PAST MEDICAL HISTORY: Past Medical History:  Diagnosis Date  . Anxiety   . Depression   . Uterine fibroid   . Vitamin D deficiency     PAST SURGICAL HISTORY: Past Surgical History:  Procedure Laterality Date  . BUNIONECTOMY  2010  . ORTHOPEDIC SURGERY     2015  . UTERINE FIBROID SURGERY  2007, 2011    SOCIAL HISTORY: Social History   Tobacco Use  . Smoking status: Never Smoker  . Smokeless  tobacco: Never Used  Substance Use Topics  . Alcohol use: Yes    Alcohol/week: 0.0 oz  . Drug use: No    FAMILY HISTORY: Family History  Problem Relation Age of Onset  . Hypothyroidism Mother   . High Cholesterol Mother   . Cancer Jill Webb        breast    ROS: Review of Systems  Constitutional: Negative for weight loss.  Gastrointestinal: Negative for nausea and vomiting.  Musculoskeletal:       Negative muscle weakness    PHYSICAL EXAM: Blood pressure 114/76, pulse 69, temperature 98.1 F (36.7 C), temperature source Oral, height 5\' 7"  (1.702 m), weight 209 lb (94.8 kg), SpO2 100 %. Body mass index is 32.73 kg/m. Physical Exam  Constitutional: She is oriented to person, place, and time. She appears well-developed and well-nourished.  Cardiovascular: Normal rate.  Pulmonary/Chest: Effort normal.  Musculoskeletal: Normal range of motion.  Neurological: She is oriented to person, place, and time.  Skin: Skin is warm and dry.  Psychiatric: She has a normal mood and affect. Her behavior is normal.  Vitals reviewed.   RECENT LABS AND TESTS: BMET    Component Value Date/Time   NA 139 07/19/2017 1114   K 4.5 07/19/2017 1114   CL 99 07/19/2017 1114   CO2 26 07/19/2017 1114   GLUCOSE 89 07/19/2017 1114  GLUCOSE 90 05/28/2017 1057   BUN 15 07/19/2017 1114   CREATININE 0.96 07/19/2017 1114   CREATININE 1.03 05/28/2017 1057   CALCIUM 9.5 07/19/2017 1114   GFRNONAA 70 07/19/2017 1114   GFRNONAA 70 05/08/2016 0907   GFRAA 80 07/19/2017 1114   GFRAA 81 05/08/2016 0907   Lab Results  Component Value Date   HGBA1C 4.4 (L) 07/19/2017   Lab Results  Component Value Date   INSULIN 8.0 07/19/2017   CBC    Component Value Date/Time   WBC 7.2 07/19/2017 1114   WBC 5.5 05/28/2017 1057   RBC 5.37 (H) 07/19/2017 1114   RBC 5.27 (H) 05/28/2017 1057   HGB 14.0 07/19/2017 1114   HCT 42.5 07/19/2017 1114   PLT 277 05/28/2017 1057   MCV 79 07/19/2017 1114   MCH 26.1 (L)  07/19/2017 1114   MCH 26.2 (L) 05/28/2017 1057   MCHC 32.9 07/19/2017 1114   MCHC 32.7 05/28/2017 1057   RDW 16.3 (H) 07/19/2017 1114   LYMPHSABS 2.0 07/19/2017 1114   MONOABS 378 05/08/2016 0907   EOSABS 0.1 07/19/2017 1114   BASOSABS 0.1 07/19/2017 1114   Iron/TIBC/Ferritin/ %Sat    Component Value Date/Time   IRON 113 05/08/2016 0907   TIBC 348 05/08/2016 0907   FERRITIN 41 05/28/2017 1057   IRONPCTSAT 32 05/08/2016 0907   Lipid Panel     Component Value Date/Time   CHOL 242 (H) 07/19/2017 1114   TRIG 104 07/19/2017 1114   HDL 70 07/19/2017 1114   CHOLHDL 2.9 09/30/2016 0956   VLDL 18 09/30/2016 0956   LDLCALC 151 (H) 07/19/2017 1114   Hepatic Function Panel     Component Value Date/Time   PROT 7.4 07/19/2017 1114   ALBUMIN 4.6 07/19/2017 1114   AST 17 07/19/2017 1114   ALT 14 07/19/2017 1114   ALKPHOS 76 07/19/2017 1114   BILITOT 0.4 07/19/2017 1114      Component Value Date/Time   TSH 0.993 07/19/2017 1114   TSH 0.79 05/28/2017 1057   TSH 1.70 09/30/2016 0956  Results for NAKAILA, FREEZE (MRN [id number]) as of 11/10/2017 17:56  Ref. Range 07/19/2017 11:14  Vitamin D, 25-Hydroxy Latest Ref Range: 30.0 - 100.0 ng/mL 32.5    ASSESSMENT AND PLAN: Vitamin D deficiency  Class 1 obesity with serious comorbidity and body mass index (BMI) of 32.0 to 32.9 in adult, unspecified obesity type  PLAN:  Vitamin D Deficiency Jill Webb was informed that low vitamin D levels contributes to fatigue and are associated with obesity, breast, and colon cancer. Jill Webb agrees to continue taking prescription Vit D @50 ,000 IU every week and will follow up for routine testing of vitamin D, at least 2-3 times per year. She was informed of the risk of over-replacement of vitamin D and agrees to not increase her dose unless she discusses this with [place] first. Jill Webb agrees to follow up with our clinic in 2 weeks.  We spent > than 50% of the 15 minute visit on the counseling as documented  in the note.  Obesity Jill Webb is currently in the action stage of change. As such, her goal is to continue with weight loss efforts She has agreed to follow the Category 2 plan Jill Webb has been instructed to work up to a goal of 150 minutes of combined cardio and strengthening exercise per week for weight loss and overall health benefits. We discussed the following Behavioral Modification Strategies today: increasing lean protein intake and work on meal planning and easy  cooking plans   Jill Webb has agreed to follow up with our clinic in 2 weeks. She was informed of the importance of frequent follow up visits to maximize her success with intensive lifestyle modifications for her multiple health conditions.   OBESITY BEHAVIORAL INTERVENTION VISIT  Today's visit was # 8 out of 22.  Starting weight: 213 lbs Starting date: 07/19/17 Today's weight : 209 lbs Today's date: 11/10/2017 Total lbs lost to date: 4 (Patients must lose 7 lbs in the first 6 months to continue with counseling)   ASK: We discussed the diagnosis of obesity with Jill Webb today and Jill Webb agreed to give [place] permission to discuss obesity behavioral modification therapy today.  ASSESS: Jill Webb has the diagnosis of obesity and her BMI today is 32.73 Jill Webb is in the action stage of change   ADVISE: Jill Webb was educated on the multiple health risks of obesity as well as the benefit of weight loss to improve her health. She was advised of the need for long term treatment and the importance of lifestyle modifications.  AGREE: Multiple dietary modification options and treatment options were discussed and  Jill Webb agreed to the above obesity treatment plan.   Jill Webb, am acting as transcriptionist for Lacy Duverney, PA-C I, Lacy Duverney [facility], have reviewed this note and agree with its content

## 2017-11-15 DIAGNOSIS — R05 Cough: Secondary | ICD-10-CM | POA: Diagnosis not present

## 2017-11-15 DIAGNOSIS — R0981 Nasal congestion: Secondary | ICD-10-CM | POA: Diagnosis not present

## 2017-11-19 DIAGNOSIS — R0981 Nasal congestion: Secondary | ICD-10-CM | POA: Diagnosis not present

## 2017-11-19 DIAGNOSIS — R05 Cough: Secondary | ICD-10-CM | POA: Diagnosis not present

## 2017-11-20 ENCOUNTER — Encounter (INDEPENDENT_AMBULATORY_CARE_PROVIDER_SITE_OTHER): Payer: Self-pay | Admitting: Physician Assistant

## 2017-11-24 ENCOUNTER — Encounter (INDEPENDENT_AMBULATORY_CARE_PROVIDER_SITE_OTHER): Payer: Self-pay

## 2017-11-24 ENCOUNTER — Ambulatory Visit (INDEPENDENT_AMBULATORY_CARE_PROVIDER_SITE_OTHER): Payer: BLUE CROSS/BLUE SHIELD | Admitting: Physician Assistant

## 2017-12-01 IMAGING — DX DG CHEST 2V
2 series · 2 of 2 positions shown · non-contrast
Comparison: 05/20/2012

CLINICAL DATA: Cough for 3 weeks

EXAM:
CHEST  2 VIEW

[chest pa]
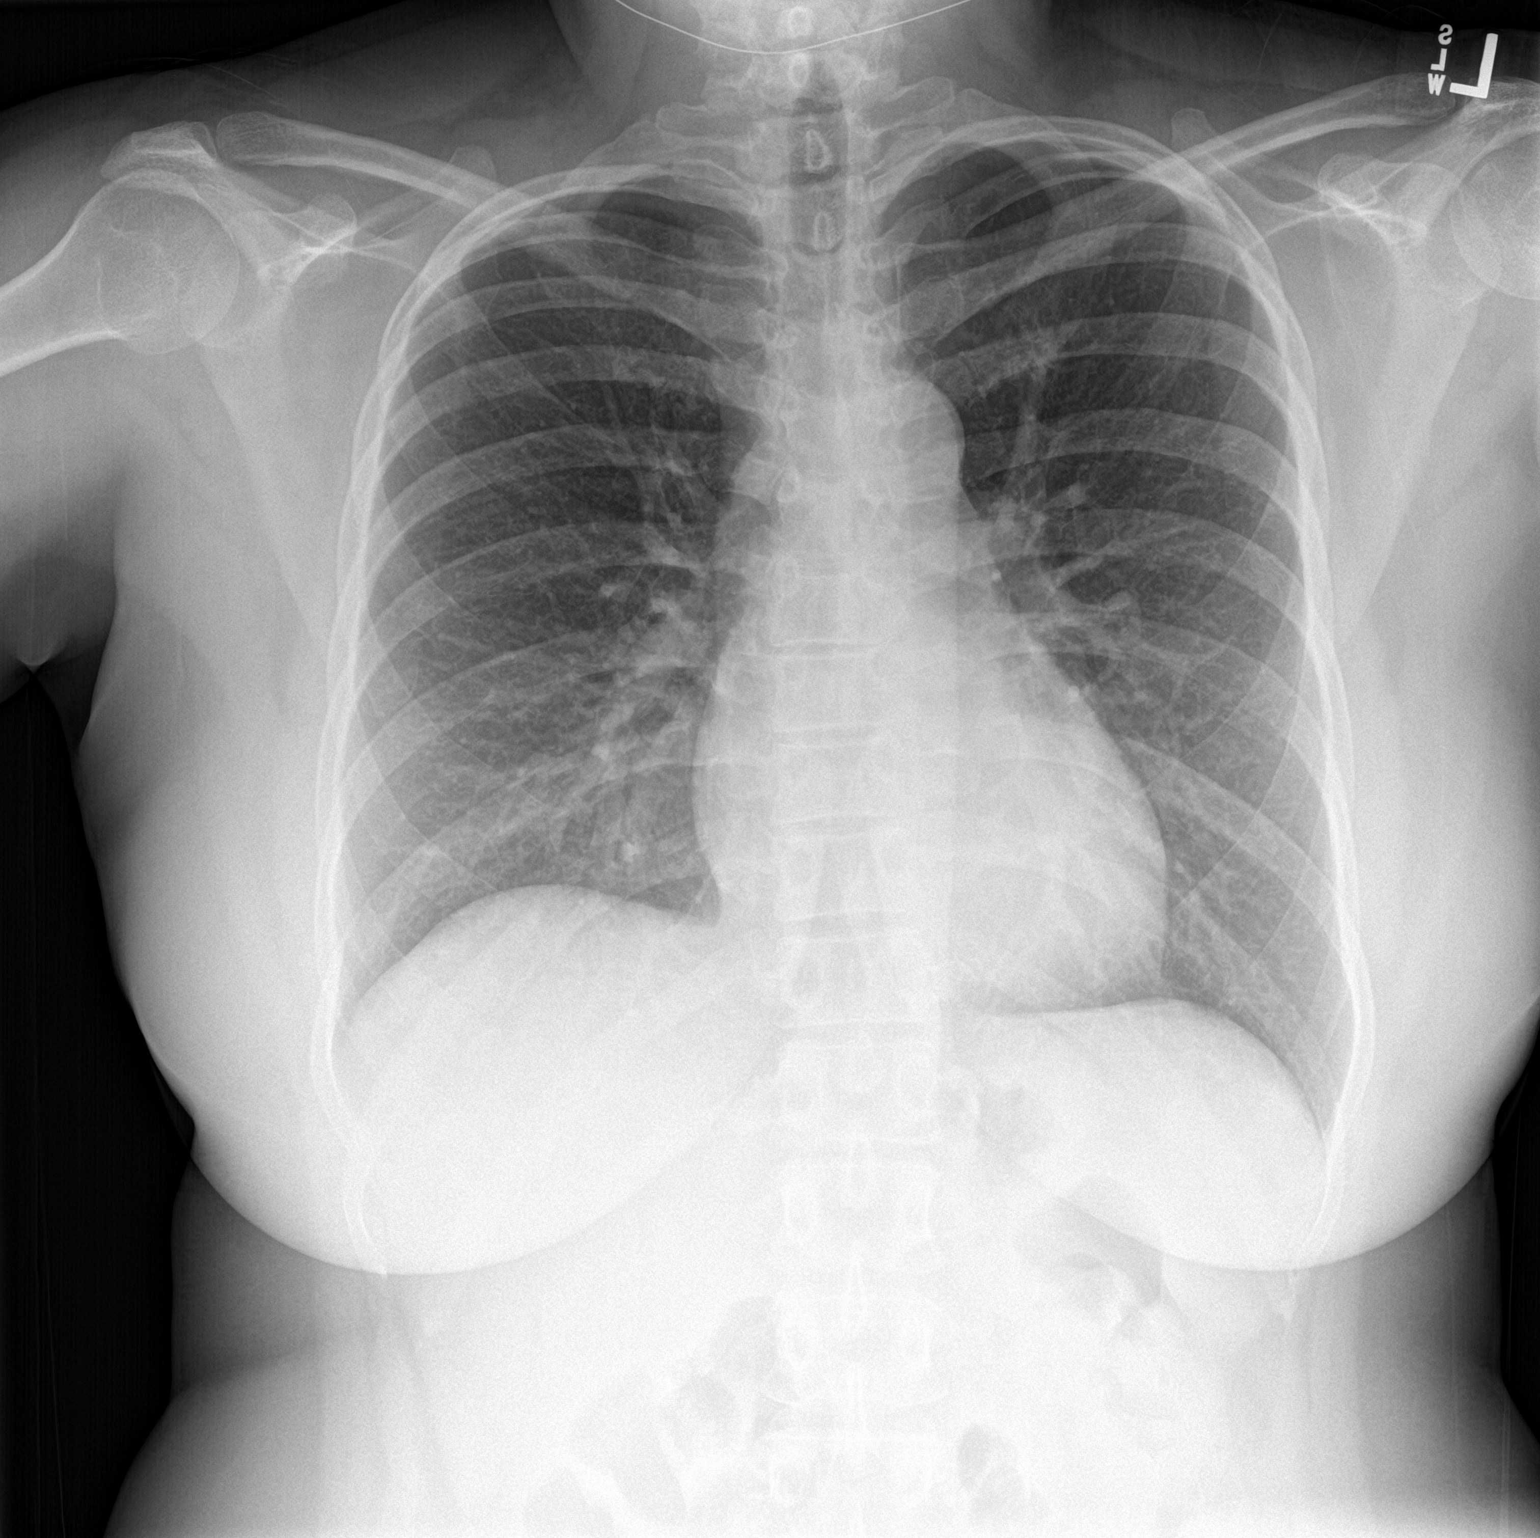

[chest lat]
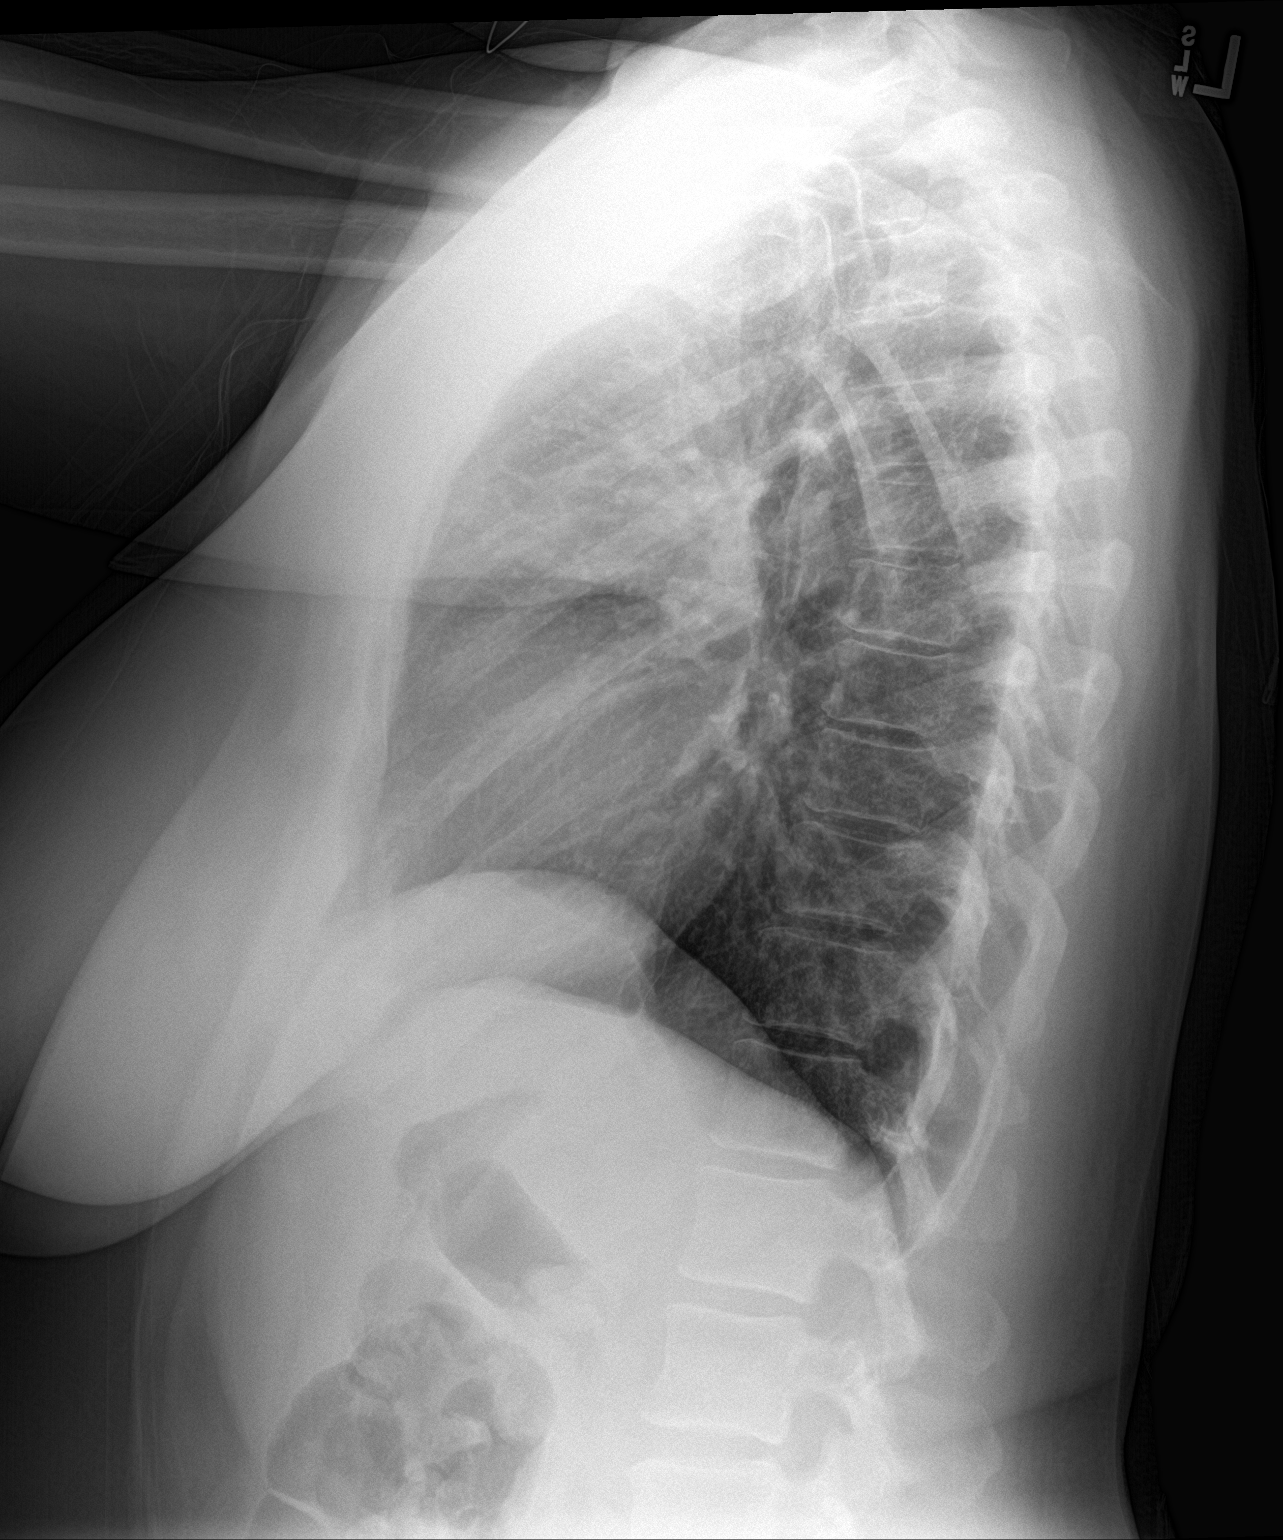

[2 of 2 positions shown; findings below may reference images not displayed]

FINDINGS: Heart and mediastinal contours are within normal limits. No focal
opacities or effusions. No acute bony abnormality.
IMPRESSION: No active cardiopulmonary disease.

## 2018-03-08 DIAGNOSIS — E669 Obesity, unspecified: Secondary | ICD-10-CM | POA: Diagnosis not present

## 2018-03-16 DIAGNOSIS — E669 Obesity, unspecified: Secondary | ICD-10-CM | POA: Diagnosis not present

## 2018-03-23 DIAGNOSIS — E669 Obesity, unspecified: Secondary | ICD-10-CM | POA: Diagnosis not present

## 2018-03-30 DIAGNOSIS — E669 Obesity, unspecified: Secondary | ICD-10-CM | POA: Diagnosis not present

## 2018-04-06 ENCOUNTER — Ambulatory Visit (INDEPENDENT_AMBULATORY_CARE_PROVIDER_SITE_OTHER): Payer: BLUE CROSS/BLUE SHIELD | Admitting: Physician Assistant

## 2018-04-06 ENCOUNTER — Encounter: Payer: Self-pay | Admitting: Physician Assistant

## 2018-04-06 VITALS — BP 118/74 | HR 83 | Ht 67.0 in | Wt 216.0 lb

## 2018-04-06 DIAGNOSIS — F3289 Other specified depressive episodes: Secondary | ICD-10-CM

## 2018-04-06 DIAGNOSIS — N951 Menopausal and female climacteric states: Secondary | ICD-10-CM

## 2018-04-06 DIAGNOSIS — M5030 Other cervical disc degeneration, unspecified cervical region: Secondary | ICD-10-CM

## 2018-04-06 DIAGNOSIS — E6609 Other obesity due to excess calories: Secondary | ICD-10-CM | POA: Diagnosis not present

## 2018-04-06 DIAGNOSIS — Z6833 Body mass index (BMI) 33.0-33.9, adult: Secondary | ICD-10-CM

## 2018-04-06 MED ORDER — ESCITALOPRAM OXALATE 5 MG PO TABS: 5.0000 mg | ORAL_TABLET | Freq: Every day | ORAL | 1 refills | Status: DC

## 2018-04-06 MED ORDER — LIRAGLUTIDE -WEIGHT MANAGEMENT 18 MG/3ML ~~LOC~~ SOPN: 0.6000 mg | PEN_INJECTOR | Freq: Every day | SUBCUTANEOUS | 1 refills | Status: DC

## 2018-04-06 MED ORDER — MELOXICAM 15 MG PO TABS: 15.0000 mg | ORAL_TABLET | Freq: Every day | ORAL | 1 refills | Status: DC | PRN

## 2018-04-06 MED ORDER — AMBULATORY NON FORMULARY MEDICATION
0 refills | Status: DC
Start: 2018-04-06 — End: 2019-06-14

## 2018-04-06 NOTE — Progress Notes (Signed)
l °

## 2018-04-06 NOTE — Patient Instructions (Signed)
Menopause and Herbal Products What is menopause? Menopause is the normal time of life when menstrual periods decrease in frequency and eventually stop completely. This process can take several years for some women. Menopause is complete when you have had an absence of menstruation for a full year since your last menstrual period. It usually occurs between the ages of 48 and 55. It is not common for menopause to begin before the age of 40. During menopause, your body stops producing the female hormones estrogen and progesterone. Common symptoms associated with this loss of hormones (vasomotor symptoms) are:  Hot flashes.  Hot flushes.  Night sweats.  Other common symptoms and complications of menopause include:  Decrease in sex drive.  Vaginal dryness and thinning of the walls of the vagina. This can make sex painful.  Dryness of the skin and development of wrinkles.  Headaches.  Tiredness.  Irritability.  Memory problems.  Weight gain.  Bladder infections.  Hair growth on the face and chest.  Inability to reproduce offspring (infertility).  Loss of density in the bones (osteoporosis) increasing your risk for breaks (fractures).  Depression.  Hardening and narrowing of the arteries (atherosclerosis). This increases your risk of heart attack and stroke.  What treatment options are available? There are many treatment choices for menopause symptoms. The most common treatment is hormone replacement therapy. Many alternative therapies for menopause are emerging, including the use of herbal products. These supplements can be found in the form of herbs, teas, oils, tinctures, and pills. Common herbal supplements for menopause are made from plants that contain phytoestrogens. Phytoestrogens are compounds that occur naturally in plants and plant products. They act like estrogen in the body. Foods and herbs that contain phytoestrogens include:  Soy.  Flax seeds.  Red  clover.  Ginseng.  What menopause symptoms may be helped if I use herbal products?  Vasomotor symptoms. These may be helped by: ? Soy. Some studies show that soy may have a moderate benefit for hot flashes. ? Black cohosh. There is limited evidence indicating this may be beneficial for hot flashes.  Symptoms that are related to heart and blood vessel disease. These may be helped by soy. Studies have shown that soy can help to lower cholesterol.  Depression. This may be helped by: ? St. John's wort. There is limited evidence that shows this may help mild to moderate depression. ? Black cohosh. There is evidence that this may help depression and mood swings.  Osteoporosis. Soy may help to decrease bone loss that is associated with menopause and may prevent osteoporosis. Limited evidence indicates that red clover may offer some bone loss protection as well. Other herbal products that are commonly used during menopause lack enough evidence to support their use as a replacement for conventional menopause therapies. These products include evening primrose, ginseng, and red clover. What are the cases when herbal products should not be used during menopause? Do not use herbal products during menopause without your health care provider's approval if:  You are taking medicine.  You have a preexisting liver condition.  Are there any risks in my taking herbal products during menopause? If you choose to use herbal products to help with symptoms of menopause, keep in mind that:  Different supplements have different and unmeasured amounts of herbal ingredients.  Herbal products are not regulated the same way that medicines are.  Concentrations of herbs may vary depending on the way they are prepared. For example, the concentration may be different in a pill,   tea, oil, and tincture.  Little is known about the risks of using herbal products, particularly the risks of long-term use.  Some herbal  supplements can be harmful when combined with certain medicines.  Most commonly reported side effects of herbal products are mild. However, if used improperly, many herbal supplements can cause serious problems. Talk to your health care provider before starting any herbal product. If problems develop, stop taking the supplement and let your health care provider know. This information is not intended to replace advice given to you by your health care provider. Make sure you discuss any questions you have with your health care provider. Document Released: 11/04/2007 Document Revised: 04/14/2016 Document Reviewed: 10/31/2013 [employer] Interactive Patient Education  2017 [employer]  

## 2018-04-08 ENCOUNTER — Encounter: Payer: Self-pay | Admitting: Physician Assistant

## 2018-04-08 DIAGNOSIS — N951 Menopausal and female climacteric states: Secondary | ICD-10-CM | POA: Insufficient documentation

## 2018-04-08 DIAGNOSIS — E66811 Other obesity due to excess calories: Secondary | ICD-10-CM | POA: Insufficient documentation

## 2018-04-08 DIAGNOSIS — E6609 Other obesity due to excess calories: Secondary | ICD-10-CM | POA: Insufficient documentation

## 2018-04-08 DIAGNOSIS — Z6834 Body mass index (BMI) 34.0-34.9, adult: Secondary | ICD-10-CM

## 2018-04-08 DIAGNOSIS — F32A Depression, unspecified: Secondary | ICD-10-CM | POA: Insufficient documentation

## 2018-04-08 DIAGNOSIS — F329 Major depressive disorder, single episode, unspecified: Secondary | ICD-10-CM | POA: Insufficient documentation

## 2018-04-08 NOTE — Progress Notes (Signed)
Subjective:    Patient ID: Jill Webb, female    DOB: 11-Feb-1966, 52 y.o.   MRN: [id number]  HPI Pt is a 52 yo post menopausal female who presents to the clinic to discuss weight gain and hot flashes.   She is very frustrated with her weight gain since 2017 when her menstrual cycle stopped. She is exercising some and going to an obesity clinic to work on nutrition. Her weight is stable to increasing. She admits she makes bad decisions like eating chips for dinner. She had hot flashes bad in 2017 then they improved but came back like 3-6 months ago. She is not doing anything for hot flashes right now.  .. Active Ambulatory Problems    Diagnosis Date Noted  . History of Helicobacter pylori infection 08/26/2015  . Vitamin D insufficiency 08/26/2015  . Tonsil asymmetry 08/26/2015  . Patchy loss of hair 05/08/2016  . Adjustment disorder with mixed anxiety and depressed mood 05/08/2016  . Fatigue 05/08/2016  . Tonsillar mass 09/30/2016  . Hyperlipidemia 10/02/2016  . DDD (degenerative disc disease), cervical 10/02/2016  . Loss of eyelashes, right 01/22/2017  . Anxiety and depression 01/22/2017  . Eyes swollen 05/26/2017  . Spotting 05/26/2017  . Vaginal discharge 05/26/2017  . No energy 05/26/2017  . Post-menopausal bleeding 05/26/2017  . Uterine fibroid 05/28/2017  . Endometrial thickening on ultrasound 05/31/2017  . Shortness of breath on exertion 07/19/2017  . Vitamin D deficiency 07/19/2017  . Class 1 obesity due to excess calories without serious comorbidity with body mass index (BMI) of 33.0 to 33.9 in adult 04/08/2018  . Depression 04/08/2018  . Hot flashes, menopausal 04/08/2018   Resolved Ambulatory Problems    Diagnosis Date Noted  . No Resolved Ambulatory Problems   Past Medical History:  Diagnosis Date  . Anxiety         Review of Systems See HPI>     Objective:   Physical Exam  Constitutional: She is oriented to person, place, and time. She  appears well-developed and well-nourished.  HENT:  Head: Normocephalic and atraumatic.  Cardiovascular: Normal rate and regular rhythm.  Pulmonary/Chest: Effort normal and breath sounds normal.  Neurological: She is alert and oriented to person, place, and time.  Psychiatric: She has a normal mood and affect. Her behavior is normal.          Assessment & Plan:  Marland KitchenMarland KitchenDiagnoses and all orders for this visit:  Class 1 obesity due to excess calories without serious comorbidity with body mass index (BMI) of 33.0 to 33.9 in adult -     Liraglutide -Weight Management (SAXENDA) 18 MG/3ML SOPN; Inject 0.6 mg into the skin daily. For one week then increase by .6mg  weekly until reaches 3mg  daily.  Please include ultra fine needles 69mm -     AMBULATORY NON FORMULARY MEDICATION; novofine 44mm needles to go with saxenda pen.  DDD (degenerative disc disease), cervical -     meloxicam (MOBIC) 15 MG tablet; Take 1 tablet (15 mg total) by mouth daily as needed for pain.  Other depression Comments: with emotional eating  Orders: -     escitalopram (LEXAPRO) 5 MG tablet; Take 1 tablet (5 mg total) by mouth daily.  Hot flashes, menopausal   ..Discussed low carb diet with 1500 calories and 80g of protein.  Exercising at least 150 minutes a week.  [employer] could be a Microbiologist.  Continue with obesity clinic.  Start saxenda. Discussed side effects.  Coupon card  given.  Follow up in 6 weeks.   Try estroven and black kohosh for hot flashes. Discussed HRT. Hold off for now due to risk. Will continue to follow up.    Next CPE after 07/2017.   Marland KitchenMarland KitchenSpent 30 minutes with patient and greater than 50 percent of visit spent counseling patient regarding treatment plan.

## 2018-05-30 ENCOUNTER — Ambulatory Visit: Payer: BLUE CROSS/BLUE SHIELD | Admitting: Physician Assistant

## 2018-06-13 ENCOUNTER — Encounter: Payer: Self-pay | Admitting: Physician Assistant

## 2018-06-13 DIAGNOSIS — Z6833 Body mass index (BMI) 33.0-33.9, adult: Principal | ICD-10-CM

## 2018-06-13 DIAGNOSIS — E6609 Other obesity due to excess calories: Secondary | ICD-10-CM

## 2018-06-13 MED ORDER — LIRAGLUTIDE -WEIGHT MANAGEMENT 18 MG/3ML ~~LOC~~ SOPN: 0.6000 mg | PEN_INJECTOR | Freq: Every day | SUBCUTANEOUS | 1 refills | Status: DC

## 2018-07-01 ENCOUNTER — Encounter: Payer: Self-pay | Admitting: Physician Assistant

## 2018-07-01 ENCOUNTER — Ambulatory Visit (INDEPENDENT_AMBULATORY_CARE_PROVIDER_SITE_OTHER): Payer: BLUE CROSS/BLUE SHIELD | Admitting: Physician Assistant

## 2018-07-01 VITALS — BP 127/89 | HR 97 | Ht 67.0 in | Wt 224.0 lb

## 2018-07-01 DIAGNOSIS — Z1211 Encounter for screening for malignant neoplasm of colon: Secondary | ICD-10-CM

## 2018-07-01 DIAGNOSIS — F3289 Other specified depressive episodes: Secondary | ICD-10-CM | POA: Diagnosis not present

## 2018-07-01 DIAGNOSIS — E559 Vitamin D deficiency, unspecified: Secondary | ICD-10-CM

## 2018-07-01 DIAGNOSIS — R5383 Other fatigue: Secondary | ICD-10-CM | POA: Diagnosis not present

## 2018-07-01 MED ORDER — BUPROPION HCL ER (XL) 150 MG PO TB24: 150.0000 mg | ORAL_TABLET | ORAL | 0 refills | Status: DC

## 2018-07-01 MED ORDER — ESCITALOPRAM OXALATE 5 MG PO TABS: 5.0000 mg | ORAL_TABLET | Freq: Every day | ORAL | 3 refills | Status: DC

## 2018-07-01 NOTE — Progress Notes (Signed)
Subjective:    Patient ID: Jill Webb, female    DOB: 1966-05-12, 53 y.o.   MRN: [id number]  HPI  Patient is a 53 year old female with a history of depression who presents to the clinic with no energy.  She feels like the fatigue has been fairly persistent for the last month.  Overall she feels like she is sleeping well.  She denies any snoring.  She denies any nighttime awakenings.  Some morning she does feel rested other morning she does not feel rested.  Overall she finds that she goes to work and she comes home and wants to go to sleep.  She can barely stay awake long enough to read at night.  She admits she is not exercising.  She has made no other medication changes.  She does have a history of vitamin D insufficiency but not taking any vitamin D. She does feel a little more down but denies any lack of motivation or anhedonia.   .. Active Ambulatory Problems    Diagnosis Date Noted  . History of Helicobacter pylori infection 08/26/2015  . Vitamin D insufficiency 08/26/2015  . Tonsil asymmetry 08/26/2015  . Patchy loss of hair 05/08/2016  . Adjustment disorder with mixed anxiety and depressed mood 05/08/2016  . Fatigue 05/08/2016  . Tonsillar mass 09/30/2016  . Hyperlipidemia 10/02/2016  . DDD (degenerative disc disease), cervical 10/02/2016  . Loss of eyelashes, right 01/22/2017  . Anxiety and depression 01/22/2017  . Eyes swollen 05/26/2017  . Spotting 05/26/2017  . Vaginal discharge 05/26/2017  . No energy 05/26/2017  . Post-menopausal bleeding 05/26/2017  . Uterine fibroid 05/28/2017  . Endometrial thickening on ultrasound 05/31/2017  . Shortness of breath on exertion 07/19/2017  . Vitamin D deficiency 07/19/2017  . Class 1 obesity due to excess calories without serious comorbidity with body mass index (BMI) of 33.0 to 33.9 in adult 04/08/2018  . Depression 04/08/2018  . Hot flashes, menopausal 04/08/2018   Resolved Ambulatory Problems    Diagnosis Date Noted  .  No Resolved Ambulatory Problems   Past Medical History:  Diagnosis Date  . Anxiety       Review of Systems  All other systems reviewed and are negative.      Objective:   Physical Exam Vitals signs reviewed.  Constitutional:      Appearance: Normal appearance.  HENT:     Head: Normocephalic and atraumatic.  Cardiovascular:     Rate and Rhythm: Normal rate and regular rhythm.  Pulmonary:     Effort: Pulmonary effort is normal.     Breath sounds: Normal breath sounds.  Neurological:     General: No focal deficit present.     Mental Status: She is alert and oriented to person, place, and time.  Psychiatric:        Mood and Affect: Mood normal.        Behavior: Behavior normal.           Assessment & Plan:  Marland KitchenMarland KitchenKelissa was seen today for follow-up.  Diagnoses and all orders for this visit:  No energy -     buPROPion (WELLBUTRIN XL) 150 MG 24 hr tablet; Take 1 tablet (150 mg total) by mouth every morning.  Other depression Comments: with emotional eating  Orders: -     escitalopram (LEXAPRO) 5 MG tablet; Take 1 tablet (5 mg total) by mouth daily.  Vitamin D insufficiency -     VITAMIN D 25 Hydroxy (Vit-D Deficiency, Fractures)  Colon cancer  screening -     [facility]olog[facility]   .[name] Depression screen [facility] 2/9 07/01/2018 04/06/2018 07/19/2017 05/26/2017 01/20/2017  Decreased Interest 0 0 1 0 0  Down, Depressed, Hopeless 0 0 1 0 1  PHQ - 2 Score 0 0 2 0 1  Altered sleeping 0 1 0 - -  Tired, decreased energy 3 0 0 - -  Change in appetite 0 0 1 - -  Feeling bad or failure about yourself  0 0 0 - -  Trouble concentrating 2 0 0 - -  Moving slowly or fidgety/restless 0 0 0 - -  Suicidal thoughts 0 0 0 - -  PHQ-9 Score 5 1 3  - -  Difficult doing work/chores Not difficult at all Not difficult at all Not difficult at all - -   .[name] GAD 7 : Generalized Anxiety Score 07/01/2018 04/06/2018 01/20/2017 06/05/2016  Nervous, Anxious, on Edge 0 1 2 1   Control/stop worrying 0 0 0 0   Worry too much - different things 0 0 0 0  Trouble relaxing 0 0 2 0  Restless 0 0 0 0  Easily annoyed or irritable 0 1 1 0  Afraid - awful might happen 0 0 0 0  Total GAD 7 Score 0 2 5 1   Anxiety Difficulty Not difficult at all Not difficult at all Somewhat difficult Not difficult at all   Added wellbutrin to help with mood and energy and weight. Not able to afford saxenda.  Start vitamin D to get it where it needs to be.  If no improvement will consider a sleep study.   [name].Discussed low carb diet with 1500 calories and 80g of protein.  Exercising at least 150 minutes a week.  [employer] Pal could be a Microbiologist.   [name].Spent 30 minutes with patient and greater than 50 percent of visit spent counseling patient regarding treatment plan.

## 2018-07-06 DIAGNOSIS — E559 Vitamin D deficiency, unspecified: Secondary | ICD-10-CM | POA: Diagnosis not present

## 2018-07-07 LAB — VITAMIN D 25 HYDROXY (VIT D DEFICIENCY, FRACTURES): Vit D, 25-Hydroxy: 32 ng/mL (ref 30–100)

## 2018-07-07 NOTE — Progress Notes (Signed)
Call pt: vitamin d low normal. I would do D3 1000 units daily OTC.

## 2018-07-09 DIAGNOSIS — J111 Influenza due to unidentified influenza virus with other respiratory manifestations: Secondary | ICD-10-CM | POA: Diagnosis not present

## 2018-07-09 DIAGNOSIS — E7800 Pure hypercholesterolemia, unspecified: Secondary | ICD-10-CM | POA: Diagnosis not present

## 2018-07-15 ENCOUNTER — Encounter: Payer: Self-pay | Admitting: Physician Assistant

## 2018-07-15 ENCOUNTER — Ambulatory Visit (INDEPENDENT_AMBULATORY_CARE_PROVIDER_SITE_OTHER): Payer: BLUE CROSS/BLUE SHIELD | Admitting: Physician Assistant

## 2018-07-15 VITALS — BP 120/68 | HR 96 | Temp 98.0°F | Ht 67.0 in | Wt 218.0 lb

## 2018-07-15 DIAGNOSIS — J0100 Acute maxillary sinusitis, unspecified: Secondary | ICD-10-CM

## 2018-07-15 DIAGNOSIS — B370 Candidal stomatitis: Secondary | ICD-10-CM | POA: Diagnosis not present

## 2018-07-15 DIAGNOSIS — J101 Influenza due to other identified influenza virus with other respiratory manifestations: Secondary | ICD-10-CM | POA: Diagnosis not present

## 2018-07-15 MED ORDER — FLUCONAZOLE 150 MG PO TABS: 150.0000 mg | ORAL_TABLET | Freq: Once | ORAL | 0 refills | Status: AC

## 2018-07-15 MED ORDER — AZITHROMYCIN 250 MG PO TABS: ORAL_TABLET | ORAL | 0 refills | Status: DC

## 2018-07-15 MED ORDER — NYSTATIN 100000 UNIT/ML MT SUSP: 500000.0000 [IU] | Freq: Four times a day (QID) | OROMUCOSAL | 0 refills | Status: DC

## 2018-07-15 NOTE — Progress Notes (Signed)
   Subjective:    Patient ID: Jill Webb, female    DOB: [birth year], 53 y.o.   MRN: [id number]  HPI  Jill Webb presents today with symptoms of sinus pressure, fever, and feeling lightheaded. She was feeling sick on Saturday and went to an urgent care where she tested positive for influenza A. She was given Tamiflu for five days which she completed on Wednesday. While on the Tamiflu she felt much better. She said starting Thursday evening her symptoms began to return. She endorses fever, headache, cough, and SOB. She denies chills, chest pain, rhinorrhea, sore throat or vomiting. She took Tylenol and DayQuil this morning which she says helped. She has not come in contact with anyone this week that has been sick. Patient did mention that starting yesterday she developed thrush on her tongue.    Review of Systems  Constitutional: Positive for fatigue and fever. Negative for chills.  HENT: Positive for sinus pressure. Negative for postnasal drip, rhinorrhea and sore throat.   Eyes: Negative.   Respiratory: Positive for shortness of breath. Negative for wheezing.   Cardiovascular: Negative for chest pain.  Gastrointestinal: Positive for nausea. Negative for diarrhea and vomiting.  Genitourinary: Negative.   Neurological: Positive for light-headedness.       Objective:   Physical Exam Constitutional:      Appearance: She is ill-appearing.  HENT:     Head: Normocephalic.     Right Ear: Tympanic membrane normal.     Left Ear: Tympanic membrane normal.     Nose: No rhinorrhea.     Mouth/Throat:     Pharynx: No oropharyngeal exudate or posterior oropharyngeal erythema.     Comments: Patient has thrush on her tongue Cardiovascular:     Rate and Rhythm: Normal rate and regular rhythm.  Pulmonary:     Breath sounds: Normal breath sounds.  Neurological:     Mental Status: She is alert.           Assessment & Plan:  Marland KitchenMarland KitchenDiagnoses and all orders for this visit:  Acute non-recurrent  maxillary sinusitis -     azithromycin (ZITHROMAX) 250 MG tablet; Take 2 tablets now and then one tablet for 5 days. -     nystatin (MYCOSTATIN) 100000 UNIT/ML suspension; Take 5 mLs (500,000 Units total) by mouth 4 (four) times daily. Swish for 30 seconds and spit out. -     fluconazole (DIFLUCAN) 150 MG tablet; Take 1 tablet (150 mg total) by mouth once for 1 dose.  Influenza A  Thrush -     nystatin (MYCOSTATIN) 100000 UNIT/ML suspension; Take 5 mLs (500,000 Units total) by mouth 4 (four) times daily. Swish for 30 seconds and spit out. -     fluconazole (DIFLUCAN) 150 MG tablet; Take 1 tablet (150 mg total) by mouth once for 1 dose.   Will treat for a bacterial secondary sickening infection. One tablet of diflucan and nystatin mouth wash given. Rest and hydrate. Reassuring vital signs today.   Marland KitchenVernetta Honey PA-C, have reviewed and agree with the above documentation in it's entirety.

## 2018-07-15 NOTE — Progress Notes (Deleted)
m °

## 2018-08-09 ENCOUNTER — Other Ambulatory Visit: Payer: Self-pay | Admitting: Physician Assistant

## 2018-08-09 MED ORDER — LORAZEPAM 0.5 MG PO TABS: 0.5000 mg | ORAL_TABLET | Freq: Three times a day (TID) | ORAL | 0 refills | Status: DC | PRN

## 2018-08-19 DIAGNOSIS — Z1211 Encounter for screening for malignant neoplasm of colon: Secondary | ICD-10-CM | POA: Diagnosis not present

## 2018-08-20 LAB — COLOGUARD: Cologuard: NEGATIVE

## 2018-08-24 ENCOUNTER — Telehealth: Payer: Self-pay | Admitting: Physician Assistant

## 2018-08-24 NOTE — Telephone Encounter (Signed)
Received a fax from [facility] that patients sample was negative and she was notified.Patient did not have any further questions. I will abstract the results in the patients chart and send form to scan.

## 2018-09-28 ENCOUNTER — Other Ambulatory Visit: Payer: Self-pay | Admitting: Physician Assistant

## 2018-09-28 DIAGNOSIS — R5383 Other fatigue: Secondary | ICD-10-CM

## 2018-09-29 ENCOUNTER — Encounter: Payer: Self-pay | Admitting: Physician Assistant

## 2018-09-29 ENCOUNTER — Ambulatory Visit (INDEPENDENT_AMBULATORY_CARE_PROVIDER_SITE_OTHER): Payer: BLUE CROSS/BLUE SHIELD | Admitting: Physician Assistant

## 2018-09-29 VITALS — HR 72 | Ht 68.0 in | Wt 226.0 lb

## 2018-09-29 DIAGNOSIS — F32A Depression, unspecified: Secondary | ICD-10-CM

## 2018-09-29 DIAGNOSIS — Z6834 Body mass index (BMI) 34.0-34.9, adult: Secondary | ICD-10-CM

## 2018-09-29 DIAGNOSIS — F419 Anxiety disorder, unspecified: Secondary | ICD-10-CM | POA: Diagnosis not present

## 2018-09-29 DIAGNOSIS — R5383 Other fatigue: Secondary | ICD-10-CM

## 2018-09-29 DIAGNOSIS — E6609 Other obesity due to excess calories: Secondary | ICD-10-CM

## 2018-09-29 DIAGNOSIS — E559 Vitamin D deficiency, unspecified: Secondary | ICD-10-CM

## 2018-09-29 DIAGNOSIS — M5030 Other cervical disc degeneration, unspecified cervical region: Secondary | ICD-10-CM

## 2018-09-29 DIAGNOSIS — F329 Major depressive disorder, single episode, unspecified: Secondary | ICD-10-CM

## 2018-09-29 MED ORDER — BUPROPION HCL ER (XL) 150 MG PO TB24: 150.0000 mg | ORAL_TABLET | ORAL | 3 refills | Status: DC

## 2018-09-29 MED ORDER — PHENTERMINE-TOPIRAMATE ER 7.5-46 MG PO CP24: 1.0000 | ORAL_CAPSULE | Freq: Every morning | ORAL | 0 refills | Status: DC

## 2018-09-29 MED ORDER — LORAZEPAM 0.5 MG PO TABS: 0.5000 mg | ORAL_TABLET | Freq: Three times a day (TID) | ORAL | 0 refills | Status: DC | PRN

## 2018-09-29 MED ORDER — MELOXICAM 15 MG PO TABS: 15.0000 mg | ORAL_TABLET | Freq: Every day | ORAL | 1 refills | Status: DC | PRN

## 2018-09-29 NOTE — Progress Notes (Signed)
..Virtual Visit via Video Note  I connected with Jill Webb on 10/03/18 at 11:10 AM EDT by a video enabled telemedicine application and verified that I am speaking with the correct person using two identifiers.  Location: Patient: home Provider: home   I discussed the limitations of evaluation and management by telemedicine and the availability of in person appointments. The patient expressed understanding and agreed to proceed.  History of Present Illness: Pt is a 53 yo obese female with cervical DDD, anxiety/depression who calls into the clinic for medication refill and to discuss weight.   She continues to gain weight. She admits she is not exercising and she eats a lot of candy especially while studying. She is graduating soon and should help her to get back to taking care of herself. saxenda was not approved by insurance. She has taken phentermine before with ok results.   Her mood is good. lexapro and wellbutrin are doing great. No SI/HC. Only using ativan very "as needed" at times not even once a week.   Needs mobic refilled. Uses as needed for her neck pain.   .. Active Ambulatory Problems    Diagnosis Date Noted  . History of Helicobacter pylori infection 08/26/2015  . Vitamin D insufficiency 08/26/2015  . Tonsil asymmetry 08/26/2015  . Patchy loss of hair 05/08/2016  . Adjustment disorder with mixed anxiety and depressed mood 05/08/2016  . Fatigue 05/08/2016  . Tonsillar mass 09/30/2016  . Hyperlipidemia 10/02/2016  . DDD (degenerative disc disease), cervical 10/02/2016  . Loss of eyelashes, right 01/22/2017  . Anxiety and depression 01/22/2017  . Eyes swollen 05/26/2017  . Spotting 05/26/2017  . Vaginal discharge 05/26/2017  . No energy 05/26/2017  . Post-menopausal bleeding 05/26/2017  . Uterine fibroid 05/28/2017  . Endometrial thickening on ultrasound 05/31/2017  . Shortness of breath on exertion 07/19/2017  . Vitamin D deficiency 07/19/2017  . Class 1  obesity due to excess calories without serious comorbidity with body mass index (BMI) of 34.0 to 34.9 in adult 04/08/2018  . Depression 04/08/2018  . Hot flashes, menopausal 04/08/2018   Resolved Ambulatory Problems    Diagnosis Date Noted  . No Resolved Ambulatory Problems   Past Medical History:  Diagnosis Date  . Anxiety    Reviewed med, allergy, problem list.     Observations/Objective: No acute distress. Obese female. Normal appearance.  Normal mood.   .. Today's Vitals   09/29/18 1050  Pulse: 72  Weight: 226 lb (102.5 kg)  Height: 5\' 8"  (1.727 m)   Body mass index is 34.36 kg/m.   .. Depression screen [facility] 2/9 09/29/2018 07/01/2018 04/06/2018 07/19/2017 05/26/2017  Decreased Interest 0 0 0 1 0  Down, Depressed, Hopeless 0 0 0 1 0  PHQ - 2 Score 0 0 0 2 0  Altered sleeping 1 0 1 0 -  Tired, decreased energy 1 3 0 0 -  Change in appetite 2 0 0 1 -  Feeling bad or failure about yourself  0 0 0 0 -  Trouble concentrating 0 2 0 0 -  Moving slowly or fidgety/restless 0 0 0 0 -  Suicidal thoughts 0 0 0 0 -  PHQ-9 Score 4 5 1 3  -  Difficult doing work/chores Not difficult at all Not difficult at all Not difficult at all Not difficult at all -   .. GAD 7 : Generalized Anxiety Score 09/29/2018 07/01/2018 04/06/2018 01/20/2017  Nervous, Anxious, on Edge 1 0 1 2  Control/stop worrying 0  0 0 0  Worry too much - different things 0 0 0 0  Trouble relaxing 0 0 0 2  Restless 0 0 0 0  Easily annoyed or irritable 0 0 1 1  Afraid - awful might happen 0 0 0 0  Total GAD 7 Score 1 0 2 5  Anxiety Difficulty Not difficult at all Not difficult at all Not difficult at all Somewhat difficult     Assessment and Plan: Marland KitchenMarland KitchenKatalyna was seen today for medication refill.  Diagnoses and all orders for this visit:  Class 1 obesity due to excess calories without serious comorbidity with body mass index (BMI) of 34.0 to 34.9 in adult -     Phentermine-Topiramate 7.5-46 MG CP24; Take 1 tablet  by mouth every morning.  No energy -     buPROPion (WELLBUTRIN XL) 150 MG 24 hr tablet; Take 1 tablet (150 mg total) by mouth every morning.  DDD (degenerative disc disease), cervical -     meloxicam (MOBIC) 15 MG tablet; Take 1 tablet (15 mg total) by mouth daily as needed for pain.  Anxiety -     LORazepam (ATIVAN) 0.5 MG tablet; Take 1 tablet (0.5 mg total) by mouth every 8 (eight) hours as needed for anxiety.  Anxiety and depression -     buPROPion (WELLBUTRIN XL) 150 MG 24 hr tablet; Take 1 tablet (150 mg total) by mouth every morning. -     LORazepam (ATIVAN) 0.5 MG tablet; Take 1 tablet (0.5 mg total) by mouth every 8 (eight) hours as needed for anxiety.  Vitamin D deficiency   Refilled medications.  saxenda not approved. Ordered qsymia. Discussed to call insurance and see if they cover any weight loss medications.  [name].Discussed low carb diet with 1500 calories and 80g of protein.  Exercising at least 150 minutes a week.  [employer]itness Pal could be a Microbiologist.  Spent some time discussing ways to battle cravings.       Follow Up Instructions:    I discussed the assessment and treatment plan with the patient. The patient was provided an opportunity to ask questions and all were answered. The patient agreed with the plan and demonstrated an understanding of the instructions.   The patient was advised to call back or seek an in-person evaluation if the symptoms worsen or if the condition fails to improve as anticipated.  I provided 25 minutes of non-face-to-face time during this encounter.   [name], PA-C

## 2018-09-29 NOTE — Telephone Encounter (Signed)
Patient has appointment today

## 2018-09-29 NOTE — Telephone Encounter (Signed)
Due for appt to follow up on depression.   Called and left pt msg to call office and schedule appt

## 2018-09-29 NOTE — Progress Notes (Signed)
-  PHQ/GAD completed  -refills pended

## 2018-11-03 ENCOUNTER — Other Ambulatory Visit: Payer: Self-pay | Admitting: Physician Assistant

## 2018-11-03 DIAGNOSIS — Z1231 Encounter for screening mammogram for malignant neoplasm of breast: Secondary | ICD-10-CM

## 2018-11-07 ENCOUNTER — Other Ambulatory Visit: Payer: Self-pay | Admitting: Physician Assistant

## 2018-11-07 ENCOUNTER — Ambulatory Visit (INDEPENDENT_AMBULATORY_CARE_PROVIDER_SITE_OTHER): Payer: BC Managed Care – PPO | Admitting: Family Medicine

## 2018-11-07 VITALS — Temp 98.2°F

## 2018-11-07 DIAGNOSIS — Z23 Encounter for immunization: Secondary | ICD-10-CM | POA: Diagnosis not present

## 2018-11-07 DIAGNOSIS — E6609 Other obesity due to excess calories: Secondary | ICD-10-CM

## 2018-11-07 NOTE — Progress Notes (Signed)
Agree with documentation as above.   Stasia Somero, MD  

## 2018-11-07 NOTE — Progress Notes (Signed)
Established Patient Office Visit  Subjective:  Patient ID: Jill Webb, female    DOB: January 13, 1966  Age: 53 y.o. MRN: [id number]  CC:  Chief Complaint  Patient presents with  . Immunizations    HPI THRESEA DOBLE presents for last shingles vaccine. She had the first vaccine at her work.   Past Medical History:  Diagnosis Date  . Anxiety   . Depression   . Uterine fibroid   . Vitamin D deficiency     Past Surgical History:  Procedure Laterality Date  . BUNIONECTOMY  2010  . ORTHOPEDIC SURGERY     2015  . UTERINE FIBROID SURGERY  2007, 2011    Family History  Problem Relation Age of Onset  . Hypothyroidism Mother   . High Cholesterol Mother   . Cancer Sister        breast    Social History   Socioeconomic History  . Marital status: Divorced    Spouse name: Not on file  . Number of children: 0  . Years of education: Not on file  . Highest education level: Not on file  Occupational History  . Occupation: Ship broker  . Occupation: Freight forwarder  Social Needs  . Financial resource strain: Not on file  . Food insecurity:    Worry: Not on file    Inability: Not on file  . Transportation needs:    Medical: Not on file    Non-medical: Not on file  Tobacco Use  . Smoking status: Never Smoker  . Smokeless tobacco: Never Used  Substance and Sexual Activity  . Alcohol use: Yes    Alcohol/week: 0.0 standard drinks  . Drug use: No  . Sexual activity: Not Currently  Lifestyle  . Physical activity:    Days per week: Not on file    Minutes per session: Not on file  . Stress: Not on file  Relationships  . Social connections:    Talks on phone: Not on file    Gets together: Not on file    Attends religious service: Not on file    Active member of club or organization: Not on file    Attends meetings of clubs or organizations: Not on file    Relationship status: Not on file  . Intimate partner violence:    Fear of current or ex partner: Not on file   Emotionally abused: Not on file    Physically abused: Not on file    Forced sexual activity: Not on file  Other Topics Concern  . Not on file  Social History Narrative  . Not on file    Outpatient Medications Prior to Visit  Medication Sig Dispense Refill  . AMBULATORY NON FORMULARY MEDICATION novofine 68mm needles to go with saxenda pen. 100 each 0  . buPROPion (WELLBUTRIN XL) 150 MG 24 hr tablet Take 1 tablet (150 mg total) by mouth every morning. 90 tablet 3  . Cholecalciferol (VITAMIN D) 50 MCG (2000 UT) CAPS Take by mouth.    . escitalopram (LEXAPRO) 5 MG tablet Take 1 tablet (5 mg total) by mouth daily. 90 tablet 3  . LORazepam (ATIVAN) 0.5 MG tablet Take 1 tablet (0.5 mg total) by mouth every 8 (eight) hours as needed for anxiety. 20 tablet 0  . meloxicam (MOBIC) 15 MG tablet Take 1 tablet (15 mg total) by mouth daily as needed for pain. 90 tablet 1  . Phentermine-Topiramate 7.5-46 MG CP24 Take 1 tablet by mouth every morning. 30 capsule 0  No facility-administered medications prior to visit.     Allergies  Allergen Reactions  . Gluten Meal Other (See Comments)    Bloating and diarrhea  . Codeine Rash    ROS Review of Systems    Objective:    Physical Exam  Temp 98.2 F (36.8 C) (Oral)   LMP  (LMP Unknown) Comment: Last period sometime in 2017 Wt Readings from Last 3 Encounters:  09/29/18 226 lb (102.5 kg)  07/15/18 218 lb (98.9 kg)  07/01/18 224 lb (101.6 kg)     Health Maintenance Due  Topic Date Due  . MAMMOGRAM  11/04/2018    There are no preventive care reminders to display for this patient.  Lab Results  Component Value Date   TSH 0.993 07/19/2017   Lab Results  Component Value Date   WBC 7.2 07/19/2017   HGB 14.0 07/19/2017   HCT 42.5 07/19/2017   MCV 79 07/19/2017   PLT 277 05/28/2017   Lab Results  Component Value Date   NA 139 07/19/2017   K 4.5 07/19/2017   CO2 26 07/19/2017   GLUCOSE 89 07/19/2017   BUN 15 07/19/2017    CREATININE 0.96 07/19/2017   BILITOT 0.4 07/19/2017   ALKPHOS 76 07/19/2017   AST 17 07/19/2017   ALT 14 07/19/2017   PROT 7.4 07/19/2017   ALBUMIN 4.6 07/19/2017   CALCIUM 9.5 07/19/2017   Lab Results  Component Value Date   CHOL 242 (H) 07/19/2017   Lab Results  Component Value Date   HDL 70 07/19/2017   Lab Results  Component Value Date   LDLCALC 151 (H) 07/19/2017   Lab Results  Component Value Date   TRIG 104 07/19/2017   Lab Results  Component Value Date   CHOLHDL 2.9 09/30/2016   Lab Results  Component Value Date   HGBA1C 4.4 (L) 07/19/2017      Assessment & Plan:  Shingles vaccine - Patient tolerated injection well without complications.    Problem List Items Addressed This Visit    None    Visit Diagnoses    Need for shingles vaccine    -  Primary   Relevant Orders   Varicella-zoster vaccine IM (Shingrix) (Completed)      No orders of the defined types were placed in this encounter.   Follow-up: No follow-ups on file.    Lavell Luster, [place]

## 2018-11-23 ENCOUNTER — Ambulatory Visit (INDEPENDENT_AMBULATORY_CARE_PROVIDER_SITE_OTHER): Payer: BC Managed Care – PPO

## 2018-11-23 ENCOUNTER — Other Ambulatory Visit: Payer: Self-pay

## 2018-11-23 DIAGNOSIS — Z1231 Encounter for screening mammogram for malignant neoplasm of breast: Secondary | ICD-10-CM

## 2018-11-23 NOTE — Progress Notes (Signed)
Normal mammogram. Follow up in one year.

## 2018-12-26 ENCOUNTER — Other Ambulatory Visit: Payer: Self-pay | Admitting: Physician Assistant

## 2018-12-26 DIAGNOSIS — F32A Depression, unspecified: Secondary | ICD-10-CM

## 2018-12-26 DIAGNOSIS — F419 Anxiety disorder, unspecified: Secondary | ICD-10-CM

## 2018-12-26 DIAGNOSIS — F329 Major depressive disorder, single episode, unspecified: Secondary | ICD-10-CM

## 2019-05-04 DIAGNOSIS — H57812 Brow ptosis, left: Secondary | ICD-10-CM | POA: Diagnosis not present

## 2019-05-04 DIAGNOSIS — G518 Other disorders of facial nerve: Secondary | ICD-10-CM | POA: Diagnosis not present

## 2019-05-05 ENCOUNTER — Encounter: Payer: Self-pay | Admitting: Physician Assistant

## 2019-05-05 DIAGNOSIS — H57812 Brow ptosis, left: Secondary | ICD-10-CM

## 2019-05-08 DIAGNOSIS — H57812 Brow ptosis, left: Secondary | ICD-10-CM | POA: Insufficient documentation

## 2019-06-09 ENCOUNTER — Ambulatory Visit: Payer: Self-pay | Admitting: Neurology

## 2019-06-14 ENCOUNTER — Encounter: Payer: Self-pay | Admitting: Neurology

## 2019-06-14 ENCOUNTER — Other Ambulatory Visit: Payer: Self-pay

## 2019-06-14 ENCOUNTER — Ambulatory Visit (INDEPENDENT_AMBULATORY_CARE_PROVIDER_SITE_OTHER): Payer: BC Managed Care – PPO | Admitting: Neurology

## 2019-06-14 VITALS — BP 141/76 | HR 82 | Temp 96.9°F | Ht 68.0 in | Wt 228.0 lb

## 2019-06-14 DIAGNOSIS — H02402 Unspecified ptosis of left eyelid: Secondary | ICD-10-CM | POA: Diagnosis not present

## 2019-06-14 DIAGNOSIS — Z5181 Encounter for therapeutic drug level monitoring: Secondary | ICD-10-CM

## 2019-06-14 DIAGNOSIS — G4489 Other headache syndrome: Secondary | ICD-10-CM | POA: Diagnosis not present

## 2019-06-14 NOTE — Progress Notes (Signed)
Reason for visit: Droopiness of the left eye  Referring physician: Dr. Annalee Genta Jill Webb is a 54 y.o. female  History of present illness:  Jill Webb is a 54 year old right-handed black female with a history of onset of some droopiness of the left eye that she noted about a year ago.  Over the last year she does not believe that this problem has worsened, the droopiness appears to be stable at all times, it does not vary in severity throughout the day.  She has not noted any other problems such as double vision or troubles with chewing or swallowing.  She has not noted any changes in speech.  She denies any neck pain, she does have occasional headache over the left frontotemporal region that occurs on average about once a week.  She reports no problems with weakness of the arms or legs or any sensory alteration of the arms or legs with exception that she has some numbness of the hands at nighttime that has been present for a couple years but has gotten worse recently.  She denies any troubles controlling the bowels or the bladder, she denies any balance issues.  She was seen by an ophthalmologist, she was not felt to have true ptosis but was felt to have some weakness with the left brow.  The patient denies a prior history of Bell's palsy.  Past Medical History:  Diagnosis Date  . Anxiety   . Depression   . Uterine fibroid   . Vitamin D deficiency     Past Surgical History:  Procedure Laterality Date  . BUNIONECTOMY  2010  . ORTHOPEDIC SURGERY     2015  . UTERINE FIBROID SURGERY  2007, 2011    Family History  Problem Relation Age of Onset  . Hypothyroidism Mother   . High Cholesterol Mother   . Cancer Sister        breast  . Other Father        unsure of medical history    Social history:  reports that she has never smoked. She has never used smokeless tobacco. She reports current alcohol use. She reports that she does not use drugs.  Medications:  Prior to  Admission medications   Medication Sig Start Date End Date Taking? Authorizing Provider  buPROPion (WELLBUTRIN XL) 150 MG 24 hr tablet Take 1 tablet (150 mg total) by mouth every morning. 09/29/18  Yes Breeback, Jade L, PA-C  Cholecalciferol (VITAMIN D) 50 MCG (2000 UT) CAPS Take by mouth.   Yes [provider]  escitalopram (LEXAPRO) 5 MG tablet Take 1 tablet (5 mg total) by mouth daily. 07/01/18  Yes Breeback, Jade L, PA-C  LORazepam (ATIVAN) 0.5 MG tablet TAKE 1 TABLET BY MOUTH EVERY 8 HOURS AS NEEDED FOR ANXIETY 12/27/18  Yes Breeback, Jade L, PA-C  meloxicam (MOBIC) 15 MG tablet Take 1 tablet (15 mg total) by mouth daily as needed for pain. 09/29/18  Yes Breeback, Jade L, PA-C      Allergies  Allergen Reactions  . Gluten Meal Other (See Comments)    Bloating and diarrhea  . Codeine Rash    ROS:  Out of a complete 14 system review of symptoms, the patient complains only of the following symptoms, and all other reviewed systems are negative.  Numbness of the hands Left eye droopiness  Blood pressure (!) 141/76, pulse 82, temperature (!) 96.9 F (36.1 C), height 5\' 8"  (1.727 m), weight 228 lb (103.4 kg).  Physical Exam  General: The patient is alert and cooperative at the time of the examination.  Eyes: Pupils are equal, round, and reactive to light. Discs are flat bilaterally.  Neck: The neck is supple, no carotid bruits are noted.  Respiratory: The respiratory examination is clear.  Cardiovascular: The cardiovascular examination reveals a regular rate and rhythm, no obvious murmurs or rubs are noted.  Skin: Extremities are without significant edema.  Neurologic Exam  Mental status: The patient is alert and oriented x 3 at the time of the examination. The patient has apparent normal recent and remote memory, with an apparently normal attention span and concentration ability.  Cranial nerves: Facial symmetry is present. There is good sensation of the face to  pinprick and soft touch bilaterally. The strength of the facial muscles and the muscles to head turning and shoulder shrug are normal bilaterally. Speech is well enunciated, no aphasia or dysarthria is noted. Extraocular movements are full. Visual fields are full. The tongue is midline, and the patient has symmetric elevation of the soft palate. No obvious hearing deficits are noted.  No true ptosis is seen, there may be some fullness of the soft tissues around the eye on the left relative to the right resulting in a mild asymmetry.  Motor: The motor testing reveals 5 over 5 strength of all 4 extremities. Good symmetric motor tone is noted throughout.  Sensory: Sensory testing is intact to pinprick, soft touch, vibration sensation, and position sense on all 4 extremities. No evidence of extinction is noted.  Coordination: Cerebellar testing reveals good finger-nose-finger and heel-to-shin bilaterally.  Tinel's sign at the wrist is minimally positive on the right, negative on the left.  Gait and station: Gait is normal. Tandem gait is normal. Romberg is negative. No drift is seen.  Reflexes: Deep tendon reflexes are symmetric and normal bilaterally. Toes are downgoing bilaterally.   Assessment/Plan:  1.  Left brow droopiness  2.  Left periorbital headache  3.  Bilateral hand numbness, possible mild carpal tunnel syndrome  The patient appears to have a minimal asymmetry with the eyes, it appears to me that she has some fullness of the soft tissues around the orbit on the left that gives her the asymmetry.  The changes are quite subtle.  The patient will be set up for blood work today, MRI of the brain will be done.  The patient has headaches in this area as well.  The patient has bilateral hand numbness that likely represents mild carpal tunnel syndrome.  She is to use wrist splints over the next couple months and if the symptoms do not abate, she is to contact our office and we will consider  nerve conduction studies.  Jill Alexanders MD 06/14/2019 8:37 AM  [facility] [address] [place], [place] [address]  Phone [phone] Fax [phone]

## 2019-06-16 LAB — COMPREHENSIVE METABOLIC PANEL
ALT: 11 IU/L (ref 0–32)
AST: 14 IU/L (ref 0–40)
Albumin/Globulin Ratio: 1.8 (ref 1.2–2.2)
Albumin: 4.5 g/dL (ref 3.8–4.9)
Alkaline Phosphatase: 85 IU/L (ref 39–117)
BUN/Creatinine Ratio: 16 (ref 9–23)
BUN: 18 mg/dL (ref 6–24)
Bilirubin Total: 0.4 mg/dL (ref 0.0–1.2)
CO2: 25 mmol/L (ref 20–29)
Calcium: 9.8 mg/dL (ref 8.7–10.2)
Chloride: 103 mmol/L (ref 96–106)
Creatinine, Ser: 1.1 mg/dL — ABNORMAL HIGH (ref 0.57–1.00)
GFR calc Af Amer: 67 mL/min/{1.73_m2} (ref >59)
GFR calc non Af Amer: 58 mL/min/{1.73_m2} — ABNORMAL LOW (ref >59)
Globulin, Total: 2.5 g/dL (ref 1.5–4.5)
Glucose: 86 mg/dL (ref 65–99)
Potassium: 4.5 mmol/L (ref 3.5–5.2)
Sodium: 141 mmol/L (ref 134–144)
Total Protein: 7 g/dL (ref 6.0–8.5)

## 2019-06-16 LAB — TSH: TSH: 1.13 u[IU]/mL (ref 0.450–4.500)

## 2019-06-16 LAB — ACETYLCHOLINE RECEPTOR, BINDING: AChR Binding Ab, Serum: 0.06 nmol/L (ref 0.00–0.24)

## 2019-10-16 ENCOUNTER — Other Ambulatory Visit: Payer: Self-pay | Admitting: Physician Assistant

## 2019-10-16 DIAGNOSIS — Z1231 Encounter for screening mammogram for malignant neoplasm of breast: Secondary | ICD-10-CM

## 2019-10-19 ENCOUNTER — Encounter: Payer: Self-pay | Admitting: Physician Assistant

## 2019-11-30 ENCOUNTER — Ambulatory Visit (INDEPENDENT_AMBULATORY_CARE_PROVIDER_SITE_OTHER): Payer: BC Managed Care – PPO

## 2019-11-30 ENCOUNTER — Other Ambulatory Visit: Payer: Self-pay

## 2019-11-30 DIAGNOSIS — Z1231 Encounter for screening mammogram for malignant neoplasm of breast: Secondary | ICD-10-CM | POA: Diagnosis not present

## 2019-12-05 NOTE — Progress Notes (Signed)
Normal mammogram. Follow up in 1 year.

## 2019-12-07 ENCOUNTER — Telehealth: Payer: Self-pay | Admitting: Neurology

## 2019-12-07 NOTE — Telephone Encounter (Signed)
Phone rep checked office voicemail's at 11:53 pt left message asking to be called to schedule her MRI

## 2019-12-12 NOTE — Telephone Encounter (Signed)
no to the covid questions MR Brain w/wo contrast Dr. Stephanie Acre Auth: [id number] (exp. 12/12/19 to 06/08/20). Patient is scheduled at [facility] for 12/20/19.

## 2019-12-20 ENCOUNTER — Telehealth: Payer: Self-pay | Admitting: Neurology

## 2019-12-20 ENCOUNTER — Ambulatory Visit: Payer: BC Managed Care – PPO

## 2019-12-20 DIAGNOSIS — G4489 Other headache syndrome: Secondary | ICD-10-CM

## 2019-12-20 DIAGNOSIS — H02402 Unspecified ptosis of left eyelid: Secondary | ICD-10-CM

## 2019-12-20 MED ORDER — GADOBENATE DIMEGLUMINE 529 MG/ML IV SOLN
20.0000 mL | Freq: Once | INTRAVENOUS | Status: AC | PRN
Start: 2019-12-20 — End: 2019-12-20
  Administered 2019-12-20: 20 mL via INTRAVENOUS

## 2019-12-20 NOTE — Telephone Encounter (Signed)
  I called the patient.  MRI of the brain was normal, no sinister etiology for ptosis.   MRI brain 12/20/19:  IMPRESSION:   Normal MRI brain (with and without).

## 2019-12-22 ENCOUNTER — Encounter: Payer: Self-pay | Admitting: Physician Assistant

## 2019-12-22 ENCOUNTER — Other Ambulatory Visit: Payer: Self-pay

## 2019-12-22 ENCOUNTER — Ambulatory Visit (INDEPENDENT_AMBULATORY_CARE_PROVIDER_SITE_OTHER): Payer: BC Managed Care – PPO | Admitting: Physician Assistant

## 2019-12-22 VITALS — BP 121/76 | HR 89 | Ht 68.0 in | Wt 232.0 lb

## 2019-12-22 DIAGNOSIS — N951 Menopausal and female climacteric states: Secondary | ICD-10-CM

## 2019-12-22 DIAGNOSIS — Z0000 Encounter for general adult medical examination without abnormal findings: Secondary | ICD-10-CM | POA: Diagnosis not present

## 2019-12-22 DIAGNOSIS — M5030 Other cervical disc degeneration, unspecified cervical region: Secondary | ICD-10-CM | POA: Diagnosis not present

## 2019-12-22 DIAGNOSIS — Z131 Encounter for screening for diabetes mellitus: Secondary | ICD-10-CM

## 2019-12-22 DIAGNOSIS — R5383 Other fatigue: Secondary | ICD-10-CM

## 2019-12-22 DIAGNOSIS — Z1159 Encounter for screening for other viral diseases: Secondary | ICD-10-CM

## 2019-12-22 DIAGNOSIS — E6609 Other obesity due to excess calories: Secondary | ICD-10-CM

## 2019-12-22 DIAGNOSIS — Z13220 Encounter for screening for lipoid disorders: Secondary | ICD-10-CM

## 2019-12-22 DIAGNOSIS — F419 Anxiety disorder, unspecified: Secondary | ICD-10-CM

## 2019-12-22 DIAGNOSIS — Z6835 Body mass index (BMI) 35.0-35.9, adult: Secondary | ICD-10-CM

## 2019-12-22 DIAGNOSIS — F32A Depression, unspecified: Secondary | ICD-10-CM

## 2019-12-22 DIAGNOSIS — F3289 Other specified depressive episodes: Secondary | ICD-10-CM

## 2019-12-22 DIAGNOSIS — F329 Major depressive disorder, single episode, unspecified: Secondary | ICD-10-CM

## 2019-12-22 MED ORDER — BUPROPION HCL ER (XL) 150 MG PO TB24: 150.0000 mg | ORAL_TABLET | ORAL | 3 refills | Status: DC

## 2019-12-22 MED ORDER — MELOXICAM 15 MG PO TABS: 15.0000 mg | ORAL_TABLET | Freq: Every day | ORAL | 3 refills | Status: DC | PRN

## 2019-12-22 MED ORDER — LORAZEPAM 0.5 MG PO TABS: 0.5000 mg | ORAL_TABLET | Freq: Three times a day (TID) | ORAL | 1 refills | Status: DC | PRN

## 2019-12-22 MED ORDER — ESCITALOPRAM OXALATE 5 MG PO TABS: 5.0000 mg | ORAL_TABLET | Freq: Every day | ORAL | 3 refills | Status: DC

## 2019-12-22 NOTE — Progress Notes (Signed)
Subjective:     Jill Webb is a 54 y.o. female and is here for a comprehensive physical exam. The patient reports problems - worsening hot flashes again. they seemed to get better and then started up again. .    Social History   Socioeconomic History   Marital status: Divorced    Spouse name: Not on file   Number of children: 0   Years of education: college   Highest education level: Master's degree (e.g., MA, MS, MEng, MEd, MSW, MBA)  Occupational History   Occupation: Freight forwarder  Tobacco Use   Smoking status: Never Smoker   Smokeless tobacco: Never Used  Substance and Sexual Activity   Alcohol use: Yes    Alcohol/week: 0.0 standard drinks    Comment: occasional use   Drug use: No   Sexual activity: Not Currently  Other Topics Concern   Not on file  Social History Narrative   Lives alone.   Right-handed.   1 cup caffeine daily.   Social Determinants of Health   Financial Resource Strain:    Difficulty of Paying Living Expenses:   Food Insecurity:    Worried About Charity fundraiser in the Last Year:    Arboriculturist in the Last Year:   Transportation Needs:    Film/video editor (Medical):    Lack of Transportation (Non-Medical):   Physical Activity:    Days of Exercise per Week:    Minutes of Exercise per Session:   Stress:    Feeling of Stress :   Social Connections:    Frequency of Communication with Friends and Family:    Frequency of Social Gatherings with Friends and Family:    Attends Religious Services:    Active Member of Clubs or Organizations:    Attends Music therapist:    Marital Status:   Intimate Partner Violence:    Fear of Current or Ex-Partner:    Emotionally Abused:    Physically Abused:    Sexually Abused:    Health Maintenance  Topic Date Due   Hepatitis C Screening  Never done   INFLUENZA VACCINE  12/31/2019   TETANUS/TDAP  06/03/2020   PAP SMEAR-Modifier  06/21/2020    MAMMOGRAM  11/29/2020   Fecal DNA (Cologuard)  08/19/2021   COVID-19 Vaccine  Completed   HIV Screening  Completed    The following portions of the patient's history were reviewed and updated as appropriate: allergies, current medications, past family history, past medical history, past social history, past surgical history and problem list.  Review of Systems Pertinent items noted in HPI and remainder of comprehensive ROS otherwise negative.   Objective:    BP 121/76    Pulse 89    Ht 5\' 8"  (1.727 m)    Wt (!) 232 lb (105.2 kg)    LMP  (LMP Unknown) Comment: Last period sometime in 2017   SpO2 97%    BMI 35.28 kg/m  General appearance: alert, cooperative, appears stated age and mildly obese Head: Normocephalic, without obvious abnormality, atraumatic Eyes: conjunctivae/corneas clear. PERRL, EOM's intact. Fundi benign. Ears: normal TM's and external ear canals both ears Nose: Nares normal. Septum midline. Mucosa normal. No drainage or sinus tenderness. Throat: lips, mucosa, and tongue normal; teeth and gums normal Neck: no adenopathy, no carotid bruit, no JVD, supple, symmetrical, trachea midline and thyroid not enlarged, symmetric, no tenderness/mass/nodules Back: symmetric, no curvature. ROM normal. No CVA tenderness. Lungs: clear to auscultation bilaterally Heart: regular  rate and rhythm, S1, S2 normal, no murmur, click, rub or gallop Abdomen: soft, non-tender; bowel sounds normal; no masses,  no organomegaly Extremities: extremities normal, atraumatic, no cyanosis or edema Pulses: 2+ and symmetric Skin: Skin color, texture, turgor normal. No rashes or lesions Lymph nodes: Cervical, supraclavicular, and axillary nodes normal. Neurologic: Alert and oriented X 3, normal strength and tone. Normal symmetric reflexes. Normal coordination and gait   .[name] Depression screen [facility] 2/9 12/22/2019 09/29/2018 07/01/2018 04/06/2018 07/19/2017  Decreased Interest 0 0 0 0 1  Down, Depressed, Hopeless  0 0 0 0 1  PHQ - 2 Score 0 0 0 0 2  Altered sleeping 2 1 0 1 0  Tired, decreased energy 0 1 3 0 0  Change in appetite 0 2 0 0 1  Feeling bad or failure about yourself  0 0 0 0 0  Trouble concentrating 0 0 2 0 0  Moving slowly or fidgety/restless 0 0 0 0 0  Suicidal thoughts 0 0 0 0 0  PHQ-9 Score 2 4 5 1 3   Difficult doing work/chores Not difficult at all Not difficult at all Not difficult at all Not difficult at all Not difficult at all   .[name] GAD 7 : Generalized Anxiety Score 12/22/2019 09/29/2018 07/01/2018 04/06/2018  Nervous, Anxious, on Edge 1 1 0 1  Control/stop worrying 0 0 0 0  Worry too much - different things 0 0 0 0  Trouble relaxing 0 0 0 0  Restless 0 0 0 0  Easily annoyed or irritable 1 0 0 1  Afraid - awful might happen 0 0 0 0  Total GAD 7 Score 2 1 0 2  Anxiety Difficulty Not difficult at all Not difficult at all Not difficult at all Not difficult at all     Assessment:    Healthy female exam.      Plan:  Marland KitchenMarland KitchenAslyn was seen today for annual exam.  Diagnoses and all orders for this visit:  Routine physical examination -     Hepatitis C Antibody -     COMPLETE METABOLIC PANEL WITH GFR -     Lipid Panel w/reflex Direct LDL -     Estradiol -     FSH/LH -     CBC -     TSH  No energy -     buPROPion (WELLBUTRIN XL) 150 MG 24 hr tablet; Take 1 tablet (150 mg total) by mouth every morning.  Anxiety and depression -     buPROPion (WELLBUTRIN XL) 150 MG 24 hr tablet; Take 1 tablet (150 mg total) by mouth every morning. -     LORazepam (ATIVAN) 0.5 MG tablet; Take 1 tablet (0.5 mg total) by mouth every 8 (eight) hours as needed. for anxiety  DDD (degenerative disc disease), cervical -     meloxicam (MOBIC) 15 MG tablet; Take 1 tablet (15 mg total) by mouth daily as needed for pain.  Other depression Comments: with emotional eating  Orders: -     escitalopram (LEXAPRO) 5 MG tablet; Take 1 tablet (5 mg total) by mouth daily.  Hot flashes, menopausal -      Estradiol -     FSH/LH -     CBC -     TSH  Encounter for hepatitis C screening test for low risk patient -     Hepatitis C Antibody  Screening for diabetes mellitus -     COMPLETE METABOLIC PANEL WITH GFR  Screening for lipid disorders -  Lipid Panel w/reflex Direct LDL   .[name] Discussed 150 minutes of exercise a week.  Encouraged vitamin D 1000 units and Calcium 1300mg  or 4 servings of dairy a day.  Fasting labs ordered.  cologuard UTD. Pap UTD.  shingrix UTD.   PHQ/GAD stable.  Discussed hot flash symptoms.  Consider HRT. Discussed risk vs benefits.  For now start black kohosh and see how that works.   [name].Discussed low carb diet with 1500 calories and 80g of protein.  Exercising at least 150 minutes a week.  My Fitness Pal could be a Microbiologist.  Discussed wegovy. Consider for weight loss.    See After Visit Summary for Counseling Recommendations

## 2019-12-22 NOTE — Patient Instructions (Addendum)
[facility]   Health Maintenance, Female Adopting a healthy lifestyle and getting preventive care are important in promoting health and wellness. Ask your health care provider about:  The right schedule for you to have regular tests and exams.  Things you can do on your own to prevent diseases and keep yourself healthy. What should I know about diet, weight, and exercise? Eat a healthy diet   Eat a diet that includes plenty of vegetables, fruits, low-fat dairy products, and lean protein.  Do not eat a lot of foods that are high in solid fats, added sugars, or sodium. Maintain a healthy weight Body mass index (BMI) is used to identify weight problems. It estimates body fat based on height and weight. Your health care provider can help determine your BMI and help you achieve or maintain a healthy weight. Get regular exercise Get regular exercise. This is one of the most important things you can do for your health. Most adults should:  Exercise for at least 150 minutes each week. The exercise should increase your heart rate and make you sweat (moderate-intensity exercise).  Do strengthening exercises at least twice a week. This is in addition to the moderate-intensity exercise.  Spend less time sitting. Even light physical activity can be beneficial. Watch cholesterol and blood lipids Have your blood tested for lipids and cholesterol at 52 years of age, then have this test every 5 years. Have your cholesterol levels checked more often if:  Your lipid or cholesterol levels are high.  You are older than 52 years of age.  You are at high risk for heart disease. What should I know about cancer screening? Depending on your health history and family history, you may need to have cancer screening at various ages. This may include screening for:  Breast cancer.  Cervical cancer.  Colorectal cancer.  Skin cancer.  Lung cancer. What should I know about heart disease, diabetes, and high  blood pressure? Blood pressure and heart disease  High blood pressure causes heart disease and increases the risk of stroke. This is more likely to develop in people who have high blood pressure readings, are of African descent, or are overweight.  Have your blood pressure checked: ? Every 3-5 years if you are 52-52 years of age. ? Every year if you are 52 years of age or older. Diabetes Have regular diabetes screenings. This checks your fasting blood sugar level. Have the screening done:  Once every three years after age 27 if you are at a normal weight and have a low risk for diabetes.  More often and at a younger age if you are overweight or have a high risk for diabetes. What should I know about preventing infection? Hepatitis B If you have a higher risk for hepatitis B, you should be screened for this virus. Talk with your health care provider to find out if you are at risk for hepatitis B infection. Hepatitis C Testing is recommended for:  Everyone born from [birth year] through 1963.  Anyone with known risk factors for hepatitis C. Sexually transmitted infections (STIs)  Get screened for STIs, including gonorrhea and chlamydia, if: ? You are sexually active and are younger than 52 years of age. ? You are older than 52 years of age and your health care provider tells you that you are at risk for this type of infection. ? Your sexual activity has changed since you were last screened, and you are at increased risk for chlamydia or gonorrhea. Ask your health  care provider if you are at risk.  Ask your health care provider about whether you are at high risk for HIV. Your health care provider may recommend a prescription medicine to help prevent HIV infection. If you choose to take medicine to prevent HIV, you should first get tested for HIV. You should then be tested every 3 months for as long as you are taking the medicine. Pregnancy  If you are about to stop having your period  (premenopausal) and you may become pregnant, seek counseling before you get pregnant.  Take 400 to 800 micrograms (mcg) of folic acid every day if you become pregnant.  Ask for birth control (contraception) if you want to prevent pregnancy. Osteoporosis and menopause Osteoporosis is a disease in which the bones lose minerals and strength with aging. This can result in bone fractures. If you are 52 years old or older, or if you are at risk for osteoporosis and fractures, ask your health care provider if you should:  Be screened for bone loss.  Take a calcium or vitamin D supplement to lower your risk of fractures.  Be given hormone replacement therapy (HRT) to treat symptoms of menopause. Follow these instructions at home: Lifestyle  Do not use any products that contain nicotine or tobacco, such as cigarettes, e-cigarettes, and chewing tobacco. If you need help quitting, ask your health care provider.  Do not use street drugs.  Do not share needles.  Ask your health care provider for help if you need support or information about quitting drugs. Alcohol use  Do not drink alcohol if: ? Your health care provider tells you not to drink. ? You are pregnant, may be pregnant, or are planning to become pregnant.  If you drink alcohol: ? Limit how much you use to 0-1 drink a day. ? Limit intake if you are breastfeeding.  Be aware of how much alcohol is in your drink. In the [place] one drink equals one 12 oz bottle of beer (355 mL), one 5 oz glass of wine (148 mL), or one 1 oz glass of hard liquor (44 mL). General instructions  Schedule regular health, dental, and eye exams.  Stay current with your vaccines.  Tell your health care provider if: ? You often feel depressed. ? You have ever been abused or do not feel safe at home. Summary  Adopting a healthy lifestyle and getting preventive care are important in promoting health and wellness.  Follow your health care provider's  instructions about healthy diet, exercising, and getting tested or screened for diseases.  Follow your health care provider's instructions on monitoring your cholesterol and blood pressure. This information is not intended to replace advice given to you by your health care provider. Make sure you discuss any questions you have with your health care provider. Document Revised: 05/11/2018 Document Reviewed: 05/11/2018 Elsevier Patient Education  [place].    Premature Ventricular Contraction  A premature ventricular contraction (PVC) is a common kind of irregular heartbeat (arrhythmia). These contractions are extra heartbeats that start in the ventricles of the heart and occur too early in the normal sequence. During the PVC, the heart's normal electrical pathway is not used, so the beat is shorter and less effective. In most cases, these contractions come and go and do not require treatment. What are the causes? Common causes of the condition include:  Smoking.  Drinking alcohol.  Certain medicines.  Some illegal drugs.  Stress.  Caffeine. Certain medical conditions can also cause PVCs:  Heart failure.  Heart attack, or coronary artery disease.  Heart valve problems.  Changes in minerals in the blood (electrolytes).  Low blood oxygen levels or high carbon dioxide levels. In many cases, the cause of this condition is not known. What are the signs or symptoms? The main symptom of this condition is fast or skipped heartbeats (palpitations). Other symptoms include:  Chest pain.  Shortness of breath.  Feeling tired.  Dizziness.  Difficulty exercising. In some cases, there are no symptoms. How is this diagnosed? This condition may be diagnosed based on:  Your medical history.  A physical exam. During the exam, the health care provider will check for irregular heartbeats.  Tests, such as: ? An ECG (electrocardiogram) to monitor the electrical activity of  your heart. ? An ambulatory cardiac monitor. This device records your heartbeats for 24 hours or more. ? Stress tests to see how exercise affects your heart rhythm and blood supply. ? An echocardiogram. This test uses sound waves (ultrasound) to produce an image of your heart. ? An electrophysiology study (EPS). This test checks for electrical problems in your heart. How is this treated? Treatment for this condition depends on any underlying conditions, the type of PVCs that you are having, and how much the symptoms are interfering with your daily life. Possible treatments include:  Avoiding things that cause premature contractions (triggers). These include caffeine and alcohol.  Taking medicines if symptoms are severe or if the extra heartbeats are frequent.  Getting treatment for underlying conditions that cause PVCs.  Having an implantable cardioverter defibrillator (ICD), if you are at risk for a serious arrhythmia. The ICD is a small device that is inserted into your chest to monitor your heartbeat. When it senses an irregular heartbeat, it sends a shock to bring the heartbeat back to normal.  Having a procedure to destroy the portion of the heart tissue that sends out abnormal signals (catheter ablation). In some cases, no treatment is required. Follow these instructions at home: Lifestyle  Do not use any products that contain nicotine or tobacco, such as cigarettes, e-cigarettes, and chewing tobacco. If you need help quitting, ask your health care provider.  Do not use illegal drugs.  Exercise regularly. Ask your health care provider what type of exercise is safe for you.  Try to get at least 7-9 hours of sleep each night, or as much as recommended by your health care provider.  Find healthy ways to manage stress. Avoid stressful situations when possible. Alcohol use  Do not drink alcohol if: ? Your health care provider tells you not to drink. ? You are pregnant, may be  pregnant, or are planning to become pregnant. ? Alcohol triggers your episodes.  If you drink alcohol: ? Limit how much you use to:  0-1 drink a day for women.  0-2 drinks a day for men.  Be aware of how much alcohol is in your drink. In the [place] one drink equals one 12 oz bottle of beer (355 mL), one 5 oz glass of wine (148 mL), or one 1 oz glass of hard liquor (44 mL). General instructions  Take over-the-counter and prescription medicines only as told by your health care provider.  If caffeine triggers episodes of PVC, do not eat, drink, or use anything with caffeine in it.  Keep all follow-up visits as told by your health care provider. This is important. Contact a health care provider if you:  Feel palpitations. Get help right away if you:  Have chest pain.  Have shortness of breath.  Have sweating for no reason.  Have nausea and vomiting.  Become light-headed or you faint. Summary  A premature ventricular contraction (PVC) is a common kind of irregular heartbeat (arrhythmia).  In most cases, these contractions come and go and do not require treatment.  You may need to wear an ambulatory cardiac monitor. This records your heartbeats for 24 hours or more.  Treatment depends on any underlying conditions, the type of PVCs that you are having, and how much the symptoms are interfering with your daily life. This information is not intended to replace advice given to you by your health care provider. Make sure you discuss any questions you have with your health care provider. Document Revised: 02/10/2018 Document Reviewed: 02/10/2018 Elsevier Patient Education  2020 [employer].

## 2019-12-25 ENCOUNTER — Encounter: Payer: Self-pay | Admitting: Physician Assistant

## 2019-12-25 DIAGNOSIS — E7800 Pure hypercholesterolemia, unspecified: Secondary | ICD-10-CM | POA: Insufficient documentation

## 2019-12-25 DIAGNOSIS — N183 Chronic kidney disease, stage 3 unspecified: Secondary | ICD-10-CM | POA: Insufficient documentation

## 2019-12-25 LAB — COMPLETE METABOLIC PANEL WITH GFR
AG Ratio: 1.6 (calc) (ref 1.0–2.5)
ALT: 15 U/L (ref 6–29)
AST: 18 U/L (ref 10–35)
Albumin: 4.5 g/dL (ref 3.6–5.1)
Alkaline phosphatase (APISO): 83 U/L (ref 37–153)
BUN/Creatinine Ratio: 20 (calc) (ref 6–22)
BUN: 22 mg/dL (ref 7–25)
CO2: 26 mmol/L (ref 20–32)
Calcium: 9.8 mg/dL (ref 8.6–10.4)
Chloride: 104 mmol/L (ref 98–110)
Creat: 1.08 mg/dL — ABNORMAL HIGH (ref 0.50–1.05)
GFR, Est African American: 69 mL/min/{1.73_m2} (ref >=60)
GFR, Est Non African American: 59 mL/min/{1.73_m2} — ABNORMAL LOW (ref >=60)
Globulin: 2.8 g/dL (calc) (ref 1.9–3.7)
Glucose, Bld: 82 mg/dL (ref 65–99)
Potassium: 4.3 mmol/L (ref 3.5–5.3)
Sodium: 139 mmol/L (ref 135–146)
Total Bilirubin: 0.5 mg/dL (ref 0.2–1.2)
Total Protein: 7.3 g/dL (ref 6.1–8.1)

## 2019-12-25 LAB — CBC
HCT: 41.1 % (ref 35.0–45.0)
Hemoglobin: 13.5 g/dL (ref 11.7–15.5)
MCH: 26.4 pg — ABNORMAL LOW (ref 27.0–33.0)
MCHC: 32.8 g/dL (ref 32.0–36.0)
MCV: 80.4 fL (ref 80.0–100.0)
MPV: 10.8 fL (ref 7.5–12.5)
Platelets: 262 10*3/uL (ref 140–400)
RBC: 5.11 10*6/uL — ABNORMAL HIGH (ref 3.80–5.10)
RDW: 15.6 % — ABNORMAL HIGH (ref 11.0–15.0)
WBC: 6.1 10*3/uL (ref 3.8–10.8)

## 2019-12-25 LAB — LIPID PANEL W/REFLEX DIRECT LDL
Cholesterol: 235 mg/dL — ABNORMAL HIGH (ref <200)
HDL: 58 mg/dL (ref >=50)
LDL Cholesterol (Calc): 144 mg/dL (calc) — ABNORMAL HIGH
Non-HDL Cholesterol (Calc): 177 mg/dL (calc) — ABNORMAL HIGH (ref <130)
Total CHOL/HDL Ratio: 4.1 (calc) (ref <5.0)
Triglycerides: 190 mg/dL — ABNORMAL HIGH (ref <150)

## 2019-12-25 LAB — ESTRADIOL: Estradiol: 27 pg/mL

## 2019-12-25 LAB — TSH: TSH: 2.71 mIU/L

## 2019-12-25 LAB — HEPATITIS C ANTIBODY
Hepatitis C Ab: NONREACTIVE
SIGNAL TO CUT-OFF: 0.01 (ref <1.00)

## 2019-12-25 LAB — FSH/LH
FSH: 76.8 m[IU]/mL
LH: 55 m[IU]/mL

## 2019-12-25 NOTE — Progress Notes (Signed)
Destiny,   Estradiol as suspected low but consistent with menopause.  Thyroid great.  Normal hemoglobin.  Liver and glucose look great.  Slightly improved but you do have a slight renal impairment. Will continue to monitor.  Cholesterol: HDL great. LdL up some but 10 year CV risk is 1.9 percent. Ok to work on lifestyle changes.

## 2019-12-26 MED ORDER — WEGOVY 0.25 MG/0.5ML ~~LOC~~ SOAJ: 1.0000 "pen " | SUBCUTANEOUS | 0 refills | Status: DC

## 2019-12-26 NOTE — Telephone Encounter (Signed)
Please send info for wegovy patient info. I sent rx.

## 2019-12-28 ENCOUNTER — Other Ambulatory Visit: Payer: Self-pay

## 2019-12-28 MED ORDER — WEGOVY 0.25 MG/0.5ML ~~LOC~~ SOAJ: 1.0000 "pen " | SUBCUTANEOUS | 0 refills | Status: DC

## 2020-01-05 MED ORDER — WEGOVY 0.25 MG/0.5ML ~~LOC~~ SOAJ: 0.2500 mg | SUBCUTANEOUS | 0 refills | Status: DC

## 2020-01-05 NOTE — Telephone Encounter (Signed)
Can you get her code information

## 2020-01-05 NOTE — Addendum Note (Signed)
Addended by: Donella Stade on: 01/05/2020 06:47 AM   Modules accepted: Orders

## 2020-02-09 ENCOUNTER — Encounter: Payer: Self-pay | Admitting: Physician Assistant

## 2020-02-12 MED ORDER — WEGOVY 0.5 MG/0.5ML ~~LOC~~ SOAJ: 0.5000 mg | SUBCUTANEOUS | 0 refills | Status: DC

## 2020-02-16 ENCOUNTER — Encounter: Payer: Self-pay | Admitting: Physician Assistant

## 2020-02-16 MED ORDER — WEGOVY 1 MG/0.5ML ~~LOC~~ SOAJ: 1.0000 mg | SUBCUTANEOUS | 0 refills | Status: DC

## 2020-02-16 NOTE — Telephone Encounter (Signed)
Pended Wegovy 1 mg. Please review and send if appropriate.

## 2020-02-16 NOTE — Addendum Note (Signed)
Addended by: Donella Stade on: 02/16/2020 04:19 PM   Modules accepted: Orders

## 2020-03-04 MED ORDER — WEGOVY 1.7 MG/0.75ML ~~LOC~~ SOAJ: 1.7000 mg | SUBCUTANEOUS | 0 refills | Status: DC

## 2020-03-04 NOTE — Addendum Note (Signed)
Addended by: Donella Stade on: 03/04/2020 04:57 PM   Modules accepted: Orders

## 2020-04-16 ENCOUNTER — Encounter: Payer: Self-pay | Admitting: Physician Assistant

## 2020-04-16 MED ORDER — WEGOVY 2.4 MG/0.75ML ~~LOC~~ SOAJ: 2.4000 mg | SUBCUTANEOUS | 2 refills | Status: DC

## 2020-04-23 MED ORDER — WEGOVY 2.4 MG/0.75ML ~~LOC~~ SOAJ: 2.4000 mg | SUBCUTANEOUS | 2 refills | Status: DC

## 2020-04-23 NOTE — Addendum Note (Signed)
Addended byAnnamaria Helling on: 04/23/2020 01:43 PM   Modules accepted: Orders

## 2020-05-09 ENCOUNTER — Encounter: Payer: Self-pay | Admitting: Physician Assistant

## 2020-05-14 ENCOUNTER — Encounter: Payer: Self-pay | Admitting: Physician Assistant

## 2020-05-17 ENCOUNTER — Other Ambulatory Visit: Payer: Self-pay

## 2020-05-17 ENCOUNTER — Ambulatory Visit (INDEPENDENT_AMBULATORY_CARE_PROVIDER_SITE_OTHER): Payer: BC Managed Care – PPO | Admitting: Physician Assistant

## 2020-05-17 VITALS — Ht 68.0 in | Wt 203.0 lb

## 2020-05-17 DIAGNOSIS — R42 Dizziness and giddiness: Secondary | ICD-10-CM

## 2020-05-17 NOTE — Progress Notes (Signed)
Subjective:    Patient ID: Jill Webb, female    DOB: 03-27-1966, 54 y.o.   MRN: [id number]  HPI  Patient is a 54 year old obese female obese female who presents to the clinic with dizziness concerns.  She is currently on wegovy and has lost 30 pounds over the past 3 months.  She has little to no appetite.  She is drinking only water.  She does admit to eating about 1 meal a day around lunchtime.  She notices the lightheadedness when she gets out of bed in the morning.  Seems to get better after she eats and drinks something.  She denies any sensation of the room spinning.  She denies any chest pain, palpitations, headaches.  She did start iron for anemia. Needs rechecked.   No other new medications have been started.   .. Active Ambulatory Problems    Diagnosis Date Noted   History of Helicobacter pylori infection 08/26/2015   Vitamin D insufficiency 08/26/2015   Tonsil asymmetry 08/26/2015   Patchy loss of hair 05/08/2016   Adjustment disorder with mixed anxiety and depressed mood 05/08/2016   Fatigue 05/08/2016   Tonsillar mass 09/30/2016   Hyperlipidemia 10/02/2016   DDD (degenerative disc disease), cervical 10/02/2016   Loss of eyelashes, right 01/22/2017   Anxiety and depression 01/22/2017   Eyes swollen 05/26/2017   Spotting 05/26/2017   Vaginal discharge 05/26/2017   No energy 05/26/2017   Post-menopausal bleeding 05/26/2017   Uterine fibroid 05/28/2017   Endometrial thickening on ultrasound 05/31/2017   Shortness of breath on exertion 07/19/2017   Vitamin D deficiency 07/19/2017   Class 2 obesity due to excess calories without serious comorbidity with body mass index (BMI) of 35.0 to 35.9 in adult 04/08/2018   Depression 04/08/2018   Hot flashes, menopausal 04/08/2018   Brow ptosis, left 05/08/2019   CKD (chronic kidney disease) stage 3, GFR 30-59 ml/min (HCC) 12/25/2019   Elevated LDL cholesterol level 12/25/2019   Dizziness 05/20/2020    Resolved Ambulatory Problems    Diagnosis Date Noted   No Resolved Ambulatory Problems   Past Medical History:  Diagnosis Date   Anxiety       Review of Systems See HPI>     Objective:   Physical Exam Vitals reviewed.  Constitutional:      Appearance: Normal appearance.  Cardiovascular:     Rate and Rhythm: Normal rate and regular rhythm.     Pulses: Normal pulses.     Heart sounds: Normal heart sounds. No murmur heard.   Pulmonary:     Effort: Pulmonary effort is normal.     Breath sounds: Normal breath sounds.  Abdominal:     General: Bowel sounds are normal. There is no distension.     Palpations: Abdomen is soft.     Tenderness: There is no abdominal tenderness.  Musculoskeletal:     Cervical back: No rigidity or tenderness.     Right lower leg: No edema.     Left lower leg: No edema.  Lymphadenopathy:     Cervical: No cervical adenopathy.  Neurological:     General: No focal deficit present.     Mental Status: She is alert and oriented to person, place, and time.     Cranial Nerves: No cranial nerve deficit.     Motor: No weakness.     Coordination: Coordination normal.     Gait: Gait normal.     Deep Tendon Reflexes: Reflexes normal.     Comments: Negative dix  hallpike bilaterally.  Negative orthostatic BP  Psychiatric:        Mood and Affect: Mood normal.        Behavior: Behavior normal.           Assessment & Plan:  Marland KitchenMarland KitchenKatrine was seen today for dizziness.  Diagnoses and all orders for this visit:  Dizziness -     CBC with Differential/Platelet -     Ferritin -     COMPLETE METABOLIC PANEL WITH GFR -     TSH   Negative for vertigo or orthostatic BP. On history pt is not eating after about 1pm every day. I think this is nutritional due to wegovy. Discussed proper nutrition and hydration. If not eating we need to back down of dose of wegovy. Labs ordered for evaluation.

## 2020-05-18 LAB — COMPLETE METABOLIC PANEL WITH GFR
AG Ratio: 1.6 (calc) (ref 1.0–2.5)
ALT: 11 U/L (ref 6–29)
AST: 14 U/L (ref 10–35)
Albumin: 4.4 g/dL (ref 3.6–5.1)
Alkaline phosphatase (APISO): 55 U/L (ref 37–153)
BUN/Creatinine Ratio: 19 (calc) (ref 6–22)
BUN: 21 mg/dL (ref 7–25)
CO2: 30 mmol/L (ref 20–32)
Calcium: 9.7 mg/dL (ref 8.6–10.4)
Chloride: 106 mmol/L (ref 98–110)
Creat: 1.1 mg/dL — ABNORMAL HIGH (ref 0.50–1.05)
GFR, Est African American: 67 mL/min/{1.73_m2} (ref >=60)
GFR, Est Non African American: 58 mL/min/{1.73_m2} — ABNORMAL LOW (ref >=60)
Globulin: 2.7 g/dL (calc) (ref 1.9–3.7)
Glucose, Bld: 85 mg/dL (ref 65–99)
Potassium: 3.9 mmol/L (ref 3.5–5.3)
Sodium: 143 mmol/L (ref 135–146)
Total Bilirubin: 0.7 mg/dL (ref 0.2–1.2)
Total Protein: 7.1 g/dL (ref 6.1–8.1)

## 2020-05-18 LAB — CBC WITH DIFFERENTIAL/PLATELET
Absolute Monocytes: 251 cells/uL (ref 200–950)
Basophils Absolute: 51 cells/uL (ref 0–200)
Basophils Relative: 0.9 %
Eosinophils Absolute: 120 cells/uL (ref 15–500)
Eosinophils Relative: 2.1 %
HCT: 40.8 % (ref 35.0–45.0)
Hemoglobin: 13.4 g/dL (ref 11.7–15.5)
Lymphs Abs: 2018 cells/uL (ref 850–3900)
MCH: 26.4 pg — ABNORMAL LOW (ref 27.0–33.0)
MCHC: 32.8 g/dL (ref 32.0–36.0)
MCV: 80.3 fL (ref 80.0–100.0)
MPV: 11.4 fL (ref 7.5–12.5)
Monocytes Relative: 4.4 %
Neutro Abs: 3260 cells/uL (ref 1500–7800)
Neutrophils Relative %: 57.2 %
Platelets: 299 10*3/uL (ref 140–400)
RBC: 5.08 10*6/uL (ref 3.80–5.10)
RDW: 15.1 % — ABNORMAL HIGH (ref 11.0–15.0)
Total Lymphocyte: 35.4 %
WBC: 5.7 10*3/uL (ref 3.8–10.8)

## 2020-05-18 LAB — FERRITIN: Ferritin: 92 ng/mL (ref 16–232)

## 2020-05-18 LAB — TSH: TSH: 0.93 mIU/L

## 2020-05-20 ENCOUNTER — Encounter: Payer: Self-pay | Admitting: Physician Assistant

## 2020-05-20 DIAGNOSIS — R42 Dizziness and giddiness: Secondary | ICD-10-CM | POA: Insufficient documentation

## 2020-05-20 NOTE — Progress Notes (Signed)
Labs good.  Thyroid perfect.  Kidney function stable.  Good iron stores.

## 2020-06-26 ENCOUNTER — Encounter: Payer: Self-pay | Admitting: Physician Assistant

## 2020-06-26 ENCOUNTER — Telehealth (INDEPENDENT_AMBULATORY_CARE_PROVIDER_SITE_OTHER): Payer: BC Managed Care – PPO | Admitting: Physician Assistant

## 2020-06-26 DIAGNOSIS — M5416 Radiculopathy, lumbar region: Secondary | ICD-10-CM | POA: Diagnosis not present

## 2020-06-26 MED ORDER — CYCLOBENZAPRINE HCL 10 MG PO TABS: 10.0000 mg | ORAL_TABLET | Freq: Three times a day (TID) | ORAL | 0 refills | Status: DC | PRN

## 2020-06-26 MED ORDER — PREDNISONE 50 MG PO TABS: 50.0000 mg | ORAL_TABLET | Freq: Every day | ORAL | 0 refills | Status: DC

## 2020-06-26 NOTE — Patient Instructions (Signed)
Sciatica Rehab Ask your health care provider which exercises are safe for you. Do exercises exactly as told by your health care provider and adjust them as directed. It is normal to feel mild stretching, pulling, tightness, or discomfort as you do these exercises. Stop right away if you feel sudden pain or your pain gets worse. Do not begin these exercises until told by your health care provider. Stretching and range-of-motion exercises These exercises warm up your muscles and joints and improve the movement and flexibility of your hips and back. These exercises also help to relieve pain, numbness, and tingling. Sciatic nerve glide 1. Sit in a chair with your head facing down toward your chest. Place your hands behind your back. Let your shoulders slump forward. 2. Slowly straighten one of your legs while you tilt your head back as if you are looking toward the ceiling. Only straighten your leg as far as you can without making your symptoms worse. 3. Hold this position for __________ seconds. 4. Slowly return to the starting position. 5. Repeat with your other leg. Repeat __________ times. Complete this exercise __________ times a day. Knee to chest with hip adduction and internal rotation 1. Lie on your back on a firm surface with both legs straight. 2. Bend one of your knees and move it up toward your chest until you feel a gentle stretch in your lower back and buttock. Then, move your knee toward the shoulder that is on the opposite side from your leg. This is hip adduction and internal rotation. ? Hold your leg in this position by holding on to the front of your knee. 3. Hold this position for __________ seconds. 4. Slowly return to the starting position. 5. Repeat with your other leg. Repeat __________ times. Complete this exercise __________ times a day.   Prone extension on elbows 1. Lie on your abdomen on a firm surface. A bed may be too soft for this exercise. 2. Prop yourself up on  your elbows. 3. Use your arms to help lift your chest up until you feel a gentle stretch in your abdomen and your lower back. ? This will place some of your body weight on your elbows. If this is uncomfortable, try stacking pillows under your chest. ? Your hips should stay down, against the surface that you are lying on. Keep your hip and back muscles relaxed. 4. Hold this position for __________ seconds. 5. Slowly relax your upper body and return to the starting position. Repeat __________ times. Complete this exercise __________ times a day.   Strengthening exercises These exercises build strength and endurance in your back. Endurance is the ability to use your muscles for a long time, even after they get tired. Pelvic tilt This exercise strengthens the muscles that lie deep in the abdomen. 1. Lie on your back on a firm surface. Bend your knees and keep your feet flat on the floor. 2. Tense your abdominal muscles. Tip your pelvis up toward the ceiling and flatten your lower back into the floor. ? To help with this exercise, you may place a small towel under your lower back and try to push your back into the towel. 3. Hold this position for __________ seconds. 4. Let your muscles relax completely before you repeat this exercise. Repeat __________ times. Complete this exercise __________ times a day. Alternating arm and leg raises 1. Get on your hands and knees on a firm surface. If you are on a hard floor, you may want to  use padding, such as an exercise mat, to cushion your knees. 2. Line up your arms and legs. Your hands should be directly below your shoulders, and your knees should be directly below your hips. 3. Lift your left leg behind you. At the same time, raise your right arm and straighten it in front of you. ? Do not lift your leg higher than your hip. ? Do not lift your arm higher than your shoulder. ? Keep your abdominal and back muscles tight. ? Keep your hips facing the  ground. ? Do not arch your back. ? Keep your balance carefully, and do not hold your breath. 4. Hold this position for __________ seconds. 5. Slowly return to the starting position. 6. Repeat with your right leg and your left arm. Repeat __________ times. Complete this exercise __________ times a day.   Posture and body mechanics Good posture and healthy body mechanics can help to relieve stress in your body's tissues and joints. Body mechanics refers to the movements and positions of your body while you do your daily activities. Posture is part of body mechanics. Good posture means:  Your spine is in its natural S-curve position (neutral).  Your shoulders are pulled back slightly.  Your head is not tipped forward. Follow these guidelines to improve your posture and body mechanics in your everyday activities. Standing  When standing, keep your spine neutral and your feet about hip width apart. Keep a slight bend in your knees. Your ears, shoulders, and hips should line up.  When you do a task in which you stand in one place for a long time, place one foot up on a stable object that is 2-4 inches (5-10 cm) high, such as a footstool. This helps keep your spine neutral.   Sitting  When sitting, keep your spine neutral and keep your feet flat on the floor. Use a footrest, if necessary, and keep your thighs parallel to the floor. Avoid rounding your shoulders, and avoid tilting your head forward.  When working at a desk or a computer, keep your desk at a height where your hands are slightly lower than your elbows. Slide your chair under your desk so you are close enough to maintain good posture.  When working at a computer, place your monitor at a height where you are looking straight ahead and you do not have to tilt your head forward or downward to look at the screen.   Resting  When lying down and resting, avoid positions that are most painful for you.  If you have pain with activities  such as sitting, bending, stooping, or squatting, lie in a position in which your body does not bend very much. For example, avoid curling up on your side with your arms and knees near your chest (fetal position).  If you have pain with activities such as standing for a long time or reaching with your arms, lie with your spine in a neutral position and bend your knees slightly. Try the following positions: ? Lying on your side with a pillow between your knees. ? Lying on your back with a pillow under your knees. Lifting  When lifting objects, keep your feet at least shoulder width apart and tighten your abdominal muscles.  Bend your knees and hips and keep your spine neutral. It is important to lift using the strength of your legs, not your back. Do not lock your knees straight out.  Always ask for help to lift heavy or awkward objects.  This information is not intended to replace advice given to you by your health care provider. Make sure you discuss any questions you have with your health care provider. Document Revised: 09/09/2018 Document Reviewed: 06/09/2018 Elsevier Patient Education  2021 [employer]  

## 2020-06-26 NOTE — Progress Notes (Signed)
Patient ID: Jill Webb, female   DOB: 1965/11/23, 55 y.o.   MRN: [id number] .Marland KitchenVirtual Visit via Video Note  I connected with [name] on 06/26/20 at  7:50 AM EST by a video enabled telemedicine application and verified that I am speaking with the correct person using two identifiers.  Location: Patient: work Provider: clinic  .Marland KitchenParticipating in visit:  Patient: Jill Webb  Provider: [name] PA-C Provider in training:Joseph Duke PA-Student   I discussed the limitations of evaluation and management by telemedicine and the availability of in person appointments. The patient expressed understanding and agreed to proceed.  History of Present Illness: Patient is a 55 year old female who calls into the clinic with low right back pain that radiates into the right lateral and anterior thigh down to knee. She denies any injury or repetitive use.  She is unaware of what caused this exacerbation.  She has had one episode of similar pain back in 2017 after jaw surgery where she was laid in 1 position for an extended amount of time.  She has had cervical radiculitis in the past.  It did clear with gabapentin, prednisone, anti-inflammatories and formal physical therapy.  She is taking meloxicam daily with little improvement.  Her pain is making it hard to get comfortable and rest at night.  She denies any bowel or bladder dysfunction, saddle anesthesia, leg weakness.  Symptoms do feel the best with standing and walking.   .. Active Ambulatory Problems    Diagnosis Date Noted  . History of Helicobacter pylori infection 08/26/2015  . Vitamin D insufficiency 08/26/2015  . Tonsil asymmetry 08/26/2015  . Patchy loss of hair 05/08/2016  . Adjustment disorder with mixed anxiety and depressed mood 05/08/2016  . Fatigue 05/08/2016  . Tonsillar mass 09/30/2016  . Hyperlipidemia 10/02/2016  . DDD (degenerative disc disease), cervical 10/02/2016  . Loss of eyelashes, right 01/22/2017  .  Anxiety and depression 01/22/2017  . Eyes swollen 05/26/2017  . Spotting 05/26/2017  . Vaginal discharge 05/26/2017  . No energy 05/26/2017  . Post-menopausal bleeding 05/26/2017  . Uterine fibroid 05/28/2017  . Endometrial thickening on ultrasound 05/31/2017  . Shortness of breath on exertion 07/19/2017  . Vitamin D deficiency 07/19/2017  . Class 2 obesity due to excess calories without serious comorbidity with body mass index (BMI) of 35.0 to 35.9 in adult 04/08/2018  . Depression 04/08/2018  . Hot flashes, menopausal 04/08/2018  . Brow ptosis, left 05/08/2019  . CKD (chronic kidney disease) stage 3, GFR 30-59 ml/min (HCC) 12/25/2019  . Elevated LDL cholesterol level 12/25/2019  . Dizziness 05/20/2020  . Right lumbar radiculitis 06/26/2020   Resolved Ambulatory Problems    Diagnosis Date Noted  . No Resolved Ambulatory Problems   Past Medical History:  Diagnosis Date  . Anxiety    Reviewed med, allergy, problem list.     Observations/Objective: No acute distress Normal mood and appearance Normal ROM of right leg.     Assessment and Plan: Marland KitchenMarland KitchenAllegra was seen today for leg pain.  Diagnoses and all orders for this visit:  Right lumbar radiculitis -     predniSONE (DELTASONE) 50 MG tablet; Take 1 tablet (50 mg total) by mouth daily. -     cyclobenzaprine (FLEXERIL) 10 MG tablet; Take 1 tablet (10 mg total) by mouth 3 (three) times daily as needed for muscle spasms. -     DG Lumbar Spine Complete   Suspect right lumbar radiculitis.  Will get x-rays.  No red flag symptoms.  Continue meloxicam.  Start prednisone burst for 5 days.  Consider adding Flexeril as needed.  Sedation warning given with this medication.  She feels like there could have been some GI upset with prednisone in the past.  Suggested that she take an over-the-counter PPI to help coat her stomach and take with a meal.  If she does not get significant relief in the next week.  We could consider adding the  gabapentin that worked well for her cervical radiculitis and consider formal physical therapy.  I will post some low back exercises in my chart for patient to start at home.  Encouraged conservative management with IcyHot patches, TENS unit, massage.  Follow-up as needed or if symptoms worsen.  If not improving we could also consider referral to Dr. Dianah Field our sports medicine provider.   Follow Up Instructions:    I discussed the assessment and treatment plan with the patient. The patient was provided an opportunity to ask questions and all were answered. The patient agreed with the plan and demonstrated an understanding of the instructions.   The patient was advised to call back or seek an in-person evaluation if the symptoms worsen or if the condition fails to improve as anticipated.     [name], PA-C

## 2020-07-01 ENCOUNTER — Encounter: Payer: Self-pay | Admitting: Physician Assistant

## 2020-07-02 MED ORDER — GABAPENTIN 100 MG PO CAPS: 100.0000 mg | ORAL_CAPSULE | Freq: Three times a day (TID) | ORAL | 0 refills | Status: DC

## 2020-07-21 ENCOUNTER — Encounter: Payer: Self-pay | Admitting: Physician Assistant

## 2020-07-22 MED ORDER — GABAPENTIN 300 MG PO CAPS: 300.0000 mg | ORAL_CAPSULE | Freq: Three times a day (TID) | ORAL | 1 refills | Status: DC

## 2020-07-26 ENCOUNTER — Ambulatory Visit (INDEPENDENT_AMBULATORY_CARE_PROVIDER_SITE_OTHER): Payer: BC Managed Care – PPO

## 2020-07-26 ENCOUNTER — Other Ambulatory Visit: Payer: Self-pay

## 2020-07-26 DIAGNOSIS — M545 Low back pain, unspecified: Secondary | ICD-10-CM | POA: Diagnosis not present

## 2020-07-26 DIAGNOSIS — M549 Dorsalgia, unspecified: Secondary | ICD-10-CM | POA: Diagnosis not present

## 2020-07-26 DIAGNOSIS — M5416 Radiculopathy, lumbar region: Secondary | ICD-10-CM

## 2020-07-29 ENCOUNTER — Encounter: Payer: Self-pay | Admitting: Physician Assistant

## 2020-07-29 DIAGNOSIS — M5136 Other intervertebral disc degeneration, lumbar region: Secondary | ICD-10-CM | POA: Insufficient documentation

## 2020-07-29 NOTE — Progress Notes (Signed)
Jill Webb,   You have lots of degenerative changes in the lower spine and most pronounced at L4 and L5. How are you symptoms? Are you still having pain radiating into leg?

## 2020-08-22 ENCOUNTER — Encounter: Payer: Self-pay | Admitting: Physician Assistant

## 2020-08-23 MED ORDER — WEGOVY 2.4 MG/0.75ML ~~LOC~~ SOAJ: 2.4000 mg | SUBCUTANEOUS | 2 refills | Status: DC

## 2020-09-30 ENCOUNTER — Encounter: Payer: Self-pay | Admitting: Physician Assistant

## 2020-10-25 ENCOUNTER — Other Ambulatory Visit: Payer: Self-pay | Admitting: Physician Assistant

## 2020-10-25 DIAGNOSIS — Z1231 Encounter for screening mammogram for malignant neoplasm of breast: Secondary | ICD-10-CM

## 2020-10-29 ENCOUNTER — Other Ambulatory Visit: Payer: Self-pay | Admitting: Neurology

## 2020-10-29 DIAGNOSIS — F3289 Other specified depressive episodes: Secondary | ICD-10-CM

## 2020-10-29 DIAGNOSIS — R5383 Other fatigue: Secondary | ICD-10-CM

## 2020-10-29 DIAGNOSIS — M5030 Other cervical disc degeneration, unspecified cervical region: Secondary | ICD-10-CM

## 2020-10-29 DIAGNOSIS — F32A Depression, unspecified: Secondary | ICD-10-CM

## 2020-10-29 DIAGNOSIS — F419 Anxiety disorder, unspecified: Secondary | ICD-10-CM

## 2020-10-29 MED ORDER — MELOXICAM 15 MG PO TABS: 15.0000 mg | ORAL_TABLET | Freq: Every day | ORAL | 0 refills | Status: DC | PRN

## 2020-10-29 MED ORDER — ESCITALOPRAM OXALATE 5 MG PO TABS
5.0000 mg | ORAL_TABLET | Freq: Every day | ORAL | 0 refills | Status: DC
Start: 2020-10-29 — End: 2020-11-11

## 2020-10-29 MED ORDER — GABAPENTIN 300 MG PO CAPS: 300.0000 mg | ORAL_CAPSULE | Freq: Three times a day (TID) | ORAL | 0 refills | Status: DC

## 2020-10-29 MED ORDER — BUPROPION HCL ER (XL) 150 MG PO TB24: 150.0000 mg | ORAL_TABLET | ORAL | 0 refills | Status: DC

## 2020-11-07 ENCOUNTER — Other Ambulatory Visit: Payer: Self-pay

## 2020-11-07 DIAGNOSIS — M2012 Hallux valgus (acquired), left foot: Secondary | ICD-10-CM | POA: Diagnosis not present

## 2020-11-07 DIAGNOSIS — M21612 Bunion of left foot: Secondary | ICD-10-CM | POA: Diagnosis not present

## 2020-11-11 ENCOUNTER — Ambulatory Visit (INDEPENDENT_AMBULATORY_CARE_PROVIDER_SITE_OTHER): Payer: BC Managed Care – PPO

## 2020-11-11 ENCOUNTER — Ambulatory Visit (INDEPENDENT_AMBULATORY_CARE_PROVIDER_SITE_OTHER): Payer: BC Managed Care – PPO | Admitting: Physician Assistant

## 2020-11-11 ENCOUNTER — Other Ambulatory Visit (HOSPITAL_COMMUNITY)
Admission: RE | Admit: 2020-11-11 | Discharge: 2020-11-11 | Disposition: A | Discharge status: Home / Self Care | Payer: BC Managed Care – PPO | Source: Ambulatory Visit | Attending: Physician Assistant | Admitting: Physician Assistant

## 2020-11-11 ENCOUNTER — Other Ambulatory Visit: Payer: Self-pay

## 2020-11-11 VITALS — BP 124/77 | HR 94 | Ht 68.0 in | Wt 208.0 lb

## 2020-11-11 DIAGNOSIS — M25562 Pain in left knee: Secondary | ICD-10-CM | POA: Diagnosis not present

## 2020-11-11 DIAGNOSIS — Z0000 Encounter for general adult medical examination without abnormal findings: Secondary | ICD-10-CM

## 2020-11-11 DIAGNOSIS — M25561 Pain in right knee: Secondary | ICD-10-CM

## 2020-11-11 DIAGNOSIS — Z23 Encounter for immunization: Secondary | ICD-10-CM

## 2020-11-11 DIAGNOSIS — Z09 Encounter for follow-up examination after completed treatment for conditions other than malignant neoplasm: Secondary | ICD-10-CM

## 2020-11-11 DIAGNOSIS — R232 Flushing: Secondary | ICD-10-CM

## 2020-11-11 DIAGNOSIS — Z131 Encounter for screening for diabetes mellitus: Secondary | ICD-10-CM

## 2020-11-11 DIAGNOSIS — Z124 Encounter for screening for malignant neoplasm of cervix: Secondary | ICD-10-CM | POA: Insufficient documentation

## 2020-11-11 DIAGNOSIS — F419 Anxiety disorder, unspecified: Secondary | ICD-10-CM

## 2020-11-11 DIAGNOSIS — Z13220 Encounter for screening for lipoid disorders: Secondary | ICD-10-CM

## 2020-11-11 DIAGNOSIS — F32A Depression, unspecified: Secondary | ICD-10-CM

## 2020-11-11 MED ORDER — ESCITALOPRAM OXALATE 10 MG PO TABS: 10.0000 mg | ORAL_TABLET | Freq: Every day | ORAL | 1 refills | Status: DC

## 2020-11-12 NOTE — Progress Notes (Signed)
No acute findings and no significant degenerative changes. Work on strengthening muscles around the knee. Ice and use NSAIDs as needed. If not improving need to consider MRI to look at soft tissue.

## 2020-11-13 DIAGNOSIS — Z13220 Encounter for screening for lipoid disorders: Secondary | ICD-10-CM | POA: Diagnosis not present

## 2020-11-13 DIAGNOSIS — Z131 Encounter for screening for diabetes mellitus: Secondary | ICD-10-CM | POA: Diagnosis not present

## 2020-11-13 DIAGNOSIS — Z0000 Encounter for general adult medical examination without abnormal findings: Secondary | ICD-10-CM | POA: Diagnosis not present

## 2020-11-13 DIAGNOSIS — R232 Flushing: Secondary | ICD-10-CM | POA: Diagnosis not present

## 2020-11-13 LAB — CYTOLOGY - PAP
Comment: NEGATIVE
Diagnosis: NEGATIVE
High risk HPV: NEGATIVE

## 2020-11-13 NOTE — Progress Notes (Signed)
Negative HPV. Normal cells. Follow up next pap in 5 years.

## 2020-11-14 ENCOUNTER — Other Ambulatory Visit: Payer: Self-pay | Admitting: Neurology

## 2020-11-14 ENCOUNTER — Encounter: Payer: Self-pay | Admitting: Physician Assistant

## 2020-11-14 DIAGNOSIS — R232 Flushing: Secondary | ICD-10-CM | POA: Insufficient documentation

## 2020-11-14 DIAGNOSIS — R748 Abnormal levels of other serum enzymes: Secondary | ICD-10-CM

## 2020-11-14 DIAGNOSIS — M25561 Pain in right knee: Secondary | ICD-10-CM | POA: Insufficient documentation

## 2020-11-14 LAB — COMPLETE METABOLIC PANEL WITH GFR
AG Ratio: 1.5 (calc) (ref 1.0–2.5)
ALT: 33 U/L — ABNORMAL HIGH (ref 6–29)
AST: 31 U/L (ref 10–35)
Albumin: 4 g/dL (ref 3.6–5.1)
Alkaline phosphatase (APISO): 77 U/L (ref 37–153)
BUN: 23 mg/dL (ref 7–25)
CO2: 28 mmol/L (ref 20–32)
Calcium: 9.3 mg/dL (ref 8.6–10.4)
Chloride: 105 mmol/L (ref 98–110)
Creat: 1.01 mg/dL (ref 0.50–1.05)
GFR, Est African American: 74 mL/min/{1.73_m2} (ref >=60)
GFR, Est Non African American: 64 mL/min/{1.73_m2} (ref >=60)
Globulin: 2.7 g/dL (calc) (ref 1.9–3.7)
Glucose, Bld: 92 mg/dL (ref 65–99)
Potassium: 4.4 mmol/L (ref 3.5–5.3)
Sodium: 140 mmol/L (ref 135–146)
Total Bilirubin: 0.6 mg/dL (ref 0.2–1.2)
Total Protein: 6.7 g/dL (ref 6.1–8.1)

## 2020-11-14 LAB — LIPID PANEL W/REFLEX DIRECT LDL
Cholesterol: 250 mg/dL — ABNORMAL HIGH (ref <200)
HDL: 71 mg/dL (ref >=50)
LDL Cholesterol (Calc): 156 mg/dL (calc) — ABNORMAL HIGH
Non-HDL Cholesterol (Calc): 179 mg/dL (calc) — ABNORMAL HIGH (ref <130)
Total CHOL/HDL Ratio: 3.5 (calc) (ref <5.0)
Triglycerides: 109 mg/dL (ref <150)

## 2020-11-14 LAB — FSH/LH
FSH: 96.2 m[IU]/mL
LH: 55.7 m[IU]/mL

## 2020-11-14 LAB — VITAMIN D 25 HYDROXY (VIT D DEFICIENCY, FRACTURES): Vit D, 25-Hydroxy: 31 ng/mL (ref 30–100)

## 2020-11-14 LAB — ESTRADIOL: Estradiol: 25 pg/mL

## 2020-11-14 LAB — TSH: TSH: 1.35 mIU/L

## 2020-11-14 NOTE — Progress Notes (Signed)
Jill Webb,   Confirmed menopause. Estrogen is low.  Thyroid great.  Kidney function improved.  Glucose great.  Vitamin D normal range but low normal. I would double what you are taking.  HDL, good cholesterol, is great.  LDL, bad cholesterol, is not to goal. Your 10 year CV risk is still lower at 1.9 percent.  One liver enzyme is up some. Not been a problem in the past avoid any tylenol, alcohol and recheck in 1 month.

## 2020-11-14 NOTE — Progress Notes (Signed)
Subjective:     Jill Webb is a 55 y.o. female and is here for a comprehensive physical exam. The patient reports  hot flashes had resolved and then got worse . She is having right knee pain for the last week too. No catching. No injury. Not taking any medication to make better.   Social History   Socioeconomic History   Marital status: Divorced    Spouse name: Not on file   Number of children: 0   Years of education: college   Highest education level: Master's degree (e.g., MA, MS, MEng, MEd, MSW, MBA)  Occupational History   Occupation: Freight forwarder  Tobacco Use   Smoking status: Never   Smokeless tobacco: Never  Substance and Sexual Activity   Alcohol use: Yes    Alcohol/week: 0.0 standard drinks    Comment: occasional use   Drug use: No   Sexual activity: Not Currently  Other Topics Concern   Not on file  Social History Narrative   Lives alone.   Right-handed.   1 cup caffeine daily.   Social Determinants of Health   Financial Resource Strain: Not on file  Food Insecurity: Not on file  Transportation Needs: Not on file  Physical Activity: Not on file  Stress: Not on file  Social Connections: Not on file  Intimate Partner Violence: Not on file   Health Maintenance  Topic Date Due   COVID-19 Vaccine (4 - Booster for Pfizer series) 09/07/2020   MAMMOGRAM  11/29/2020   INFLUENZA VACCINE  12/30/2020   Fecal DNA (Cologuard)  08/19/2021   PAP SMEAR-Modifier  11/12/2023   TETANUS/TDAP  11/12/2030   Hepatitis C Screening  Completed   HIV Screening  Completed   Zoster Vaccines- Shingrix  Completed   Pneumococcal Vaccine 81-73 Years old  Aged Out   HPV VACCINES  Aged Out    The following portions of the patient's history were reviewed and updated as appropriate: allergies, current medications, past family history, past medical history, past social history, past surgical history, and problem list.  Review of Systems Pertinent items noted in HPI and remainder of  comprehensive ROS otherwise negative.   Objective:    BP 124/77   Pulse 94   Ht 5\' 8"  (1.727 m)   Wt 208 lb (94.3 kg)   LMP  (LMP Unknown) Comment: Last period sometime in 2017  SpO2 99%   BMI 31.63 kg/m  General appearance: alert, cooperative, appears stated age, and mildly obese Head: Normocephalic, without obvious abnormality, atraumatic Eyes: conjunctivae/corneas clear. PERRL, EOM's intact. Fundi benign. Ears: normal TM's and external ear canals both ears Nose: Nares normal. Septum midline. Mucosa normal. No drainage or sinus tenderness. Throat: lips, mucosa, and tongue normal; teeth and gums normal Neck: no adenopathy, no carotid bruit, no JVD, supple, symmetrical, trachea midline, and thyroid not enlarged, symmetric, no tenderness/mass/nodules Back: symmetric, no curvature. ROM normal. No CVA tenderness. Lungs: clear to auscultation bilaterally Heart: regular rate and rhythm, S1, S2 normal, no murmur, click, rub or gallop Abdomen: soft, non-tender; bowel sounds normal; no masses,  no organomegaly Pelvic: cervix normal in appearance, external genitalia normal, no adnexal masses or tenderness, no cervical motion tenderness, rectovaginal septum normal, uterus normal size, shape, and consistency, and vagina normal without discharge Extremities: extremities normal, atraumatic, no cyanosis or edema Pulses: 2+ and symmetric right knee full ROM, strength 5/5. Medial joint tenderness to palpation. No swelling or bruising or redness.  Skin: Skin color, texture, turgor normal. No rashes or lesions  Lymph nodes: Cervical, supraclavicular, and axillary nodes normal. Neurologic: Grossly normal   .. Depression screen [facility] 2/9 11/11/2020 05/17/2020 12/22/2019 09/29/2018 07/01/2018  Decreased Interest 0 0 0 0 0  Down, Depressed, Hopeless 0 0 0 0 0  PHQ - 2 Score 0 0 0 0 0  Altered sleeping 2 1 2 1  0  Tired, decreased energy 1 1 0 1 3  Change in appetite 0 2 0 2 0  Feeling bad or failure about  yourself  0 0 0 0 0  Trouble concentrating 0 0 0 0 2  Moving slowly or fidgety/restless 0 0 0 0 0  Suicidal thoughts 0 0 0 0 0  PHQ-9 Score 3 4 2 4 5   Difficult doing work/chores Not difficult at all Not difficult at all Not difficult at all Not difficult at all Not difficult at all  Some recent data might be hidden    Assessment:    Healthy female exam.      Plan:    Marland KitchenMarland KitchenDilan was seen today for follow-up.  Diagnoses and all orders for this visit:  Routine physical examination -     Lipid Panel w/reflex Direct LDL -     COMPLETE METABOLIC PANEL WITH GFR -     DG Knee 1-2 Views Left; Future -     DG Knee Complete 4 Views Right; Future -     TSH -     FSH/LH -     VITAMIN D 25 Hydroxy (Vit-D Deficiency, Fractures)  Papanicolaou smear -     Cytology - PAP  Need for Tdap vaccination -     Tdap vaccine greater than or equal to 7yo IM  Acute pain of right knee -     DG Knee Complete 4 Views Right; Future  Screening for diabetes mellitus -     COMPLETE METABOLIC PANEL WITH GFR  Screening for lipid disorders -     Lipid Panel w/reflex Direct LDL  Hot flashes -     Estradiol  Anxiety and depression -     escitalopram (LEXAPRO) 10 MG tablet; Take 1 tablet (10 mg total) by mouth daily.  .. Discussed 150 minutes of exercise a week.  Encouraged vitamin D 1000 units and Calcium 1300mg  or 4 servings of dairy a day.  PHQ/GAD up some. Increased lexapro.  Fasting labs ordered.  Pap done today.  Mammogram UTD.  Colonoscopy UTD.  Flu/covid UTD.  Tdap done today.   Xray right knee.  Discussed knee strengthening exercises.  NsAIDs as needed.  Follow up as needed.  Work on weight loss.   See After Visit Summary for Counseling Recommendations

## 2020-11-29 ENCOUNTER — Encounter: Payer: Self-pay | Admitting: Physician Assistant

## 2020-11-29 ENCOUNTER — Telehealth (INDEPENDENT_AMBULATORY_CARE_PROVIDER_SITE_OTHER): Payer: BC Managed Care – PPO | Admitting: Physician Assistant

## 2020-11-29 VITALS — Temp 98.5°F

## 2020-11-29 DIAGNOSIS — E6609 Other obesity due to excess calories: Secondary | ICD-10-CM

## 2020-11-29 DIAGNOSIS — Z6835 Body mass index (BMI) 35.0-35.9, adult: Secondary | ICD-10-CM | POA: Diagnosis not present

## 2020-11-29 DIAGNOSIS — R059 Cough, unspecified: Secondary | ICD-10-CM | POA: Diagnosis not present

## 2020-11-29 DIAGNOSIS — U071 COVID-19: Secondary | ICD-10-CM | POA: Diagnosis not present

## 2020-11-29 MED ORDER — HYDROCOD POLST-CPM POLST ER 10-8 MG/5ML PO SUER: 5.0000 mL | Freq: Two times a day (BID) | ORAL | 0 refills | Status: DC | PRN

## 2020-11-29 MED ORDER — MOLNUPIRAVIR EUA 200MG CAPSULE: 4.0000 | ORAL_CAPSULE | Freq: Two times a day (BID) | ORAL | 0 refills | Status: AC

## 2020-11-29 MED ORDER — ALBUTEROL SULFATE HFA 108 (90 BASE) MCG/ACT IN AERS: 2.0000 | INHALATION_SPRAY | Freq: Four times a day (QID) | RESPIRATORY_TRACT | 0 refills | Status: DC | PRN

## 2020-11-29 NOTE — Progress Notes (Signed)
..Virtual Visit via Video Note  I connected with [name] on 11/29/20 at  3:40 PM EDT by a video enabled telemedicine application and verified that I am speaking with the correct person using two identifiers.  Location: Patient: home Provider: clinic  .Marland KitchenParticipating in visit:  Patient: Jill Webb  Provider: [name] PA-C   I discussed the limitations of evaluation and management by telemedicine and the availability of in person appointments. The patient expressed understanding and agreed to proceed.  History of Present Illness: Pt is a 55 yo obese female who calls into the clinic to discuss positive covid test this morning. Had 3 vaccinations for covid. Symptoms started Wednesday. Positive covid test today. She is having cough, sinus pressure, runny nose, fatigue, body aches, loss of smell and taste. No GI symptoms. No problems breathing. She was exposed to covid on Sunday. She is taking advil cold and sinus severe with little relief.   .. Active Ambulatory Problems    Diagnosis Date Noted   History of Helicobacter pylori infection 08/26/2015   Vitamin D insufficiency 08/26/2015   Tonsil asymmetry 08/26/2015   Patchy loss of hair 05/08/2016   Adjustment disorder with mixed anxiety and depressed mood 05/08/2016   Fatigue 05/08/2016   Tonsillar mass 09/30/2016   Hyperlipidemia 10/02/2016   DDD (degenerative disc disease), cervical 10/02/2016   Loss of eyelashes, right 01/22/2017   Anxiety and depression 01/22/2017   Eyes swollen 05/26/2017   Spotting 05/26/2017   Vaginal discharge 05/26/2017   No energy 05/26/2017   Post-menopausal bleeding 05/26/2017   Uterine fibroid 05/28/2017   Endometrial thickening on ultrasound 05/31/2017   Shortness of breath on exertion 07/19/2017   Vitamin D deficiency 07/19/2017   Class 2 obesity due to excess calories without serious comorbidity with body mass index (BMI) of 35.0 to 35.9 in adult 04/08/2018   Depression 04/08/2018   Hot  flashes, menopausal 04/08/2018   Brow ptosis, left 05/08/2019   CKD (chronic kidney disease) stage 3, GFR 30-59 ml/min (HCC) 12/25/2019   Elevated LDL cholesterol level 12/25/2019   Dizziness 05/20/2020   Right lumbar radiculitis 06/26/2020   DDD (degenerative disc disease), lumbar 07/29/2020   Acute pain of right knee 11/14/2020   Hot flashes 11/14/2020   Resolved Ambulatory Problems    Diagnosis Date Noted   No Resolved Ambulatory Problems   Past Medical History:  Diagnosis Date   Anxiety    Reviewed med, allergy, problem list.    Observations/Objective: No acute distress Productive hacking cough Normal mood No labored breathing or wheezing.   .. Today's Vitals   11/29/20 1342  Temp: 98.5 F (36.9 C)   There is no height or weight on file to calculate BMI.    Assessment and Plan: Marland KitchenMarland KitchenDiagnoses and all orders for this visit:  COVID-19 virus infection -     albuterol (VENTOLIN HFA) 108 (90 Base) MCG/ACT inhaler; Inhale 2 puffs into the lungs every 6 (six) hours as needed. -     chlorpheniramine-HYDROcodone ([place]) 10-8 MG/5ML SUER; Take 5 mLs by mouth every 12 (twelve) hours as needed. -     molnupiravir EUA 200 mg CAPS; Take 4 capsules (800 mg total) by mouth 2 (two) times daily for 5 days.  Cough -     chlorpheniramine-HYDROcodone (TUSSIONEX) 10-8 MG/5ML SUER; Take 5 mLs by mouth every 12 (twelve) hours as needed.  Class 2 obesity due to excess calories without serious comorbidity with body mass index (BMI) of 35.0 to 35.9 in adult  Day  3 of symptoms. Discussed antiviral and wants to start.  Sent molnupiravir since on wellbutrin and concern with paxlovid with certains medication.  Obesity as risk factor.  [name].Vitamin D3 5000 IU (125 mcg) daily Vitamin C 500 mg twice daily Zinc 50 to 75 mg daily July 4 out of quarantine but mask for 5 more days.  Symptomatic care discussed.  Tussionex and albuterol given for cough.  Discussed red flags for covid sequela.   Follow up as needed.    Follow Up Instructions:    I discussed the assessment and treatment plan with the patient. The patient was provided an opportunity to ask questions and all were answered. The patient agreed with the plan and demonstrated an understanding of the instructions.   The patient was advised to call back or seek an in-person evaluation if the symptoms worsen or if the condition fails to improve as anticipated.   [name], PA-C

## 2020-11-29 NOTE — Progress Notes (Signed)
Pt took COVID test this morning she stated that on Wednesday she took 2 covid test they were both negative.   Her sxs are sinus pressure/post nasal drip cough. Advil siunus and cold and nyquil

## 2020-12-04 ENCOUNTER — Ambulatory Visit: Payer: BC Managed Care – PPO

## 2020-12-18 ENCOUNTER — Ambulatory Visit (INDEPENDENT_AMBULATORY_CARE_PROVIDER_SITE_OTHER): Payer: BC Managed Care – PPO

## 2020-12-18 ENCOUNTER — Other Ambulatory Visit: Payer: Self-pay

## 2020-12-18 DIAGNOSIS — Z1231 Encounter for screening mammogram for malignant neoplasm of breast: Secondary | ICD-10-CM | POA: Diagnosis not present

## 2021-01-06 ENCOUNTER — Encounter: Payer: Self-pay | Admitting: Physician Assistant

## 2021-01-08 ENCOUNTER — Ambulatory Visit (INDEPENDENT_AMBULATORY_CARE_PROVIDER_SITE_OTHER): Payer: BC Managed Care – PPO | Admitting: Physician Assistant

## 2021-01-08 ENCOUNTER — Encounter: Payer: Self-pay | Admitting: Physician Assistant

## 2021-01-08 ENCOUNTER — Ambulatory Visit (INDEPENDENT_AMBULATORY_CARE_PROVIDER_SITE_OTHER): Payer: BC Managed Care – PPO

## 2021-01-08 ENCOUNTER — Other Ambulatory Visit: Payer: Self-pay

## 2021-01-08 VITALS — BP 123/80 | HR 75 | Ht 68.0 in | Wt 221.0 lb

## 2021-01-08 DIAGNOSIS — R053 Chronic cough: Secondary | ICD-10-CM | POA: Diagnosis not present

## 2021-01-08 DIAGNOSIS — R058 Other specified cough: Secondary | ICD-10-CM

## 2021-01-08 DIAGNOSIS — U099 Post covid-19 condition, unspecified: Secondary | ICD-10-CM | POA: Diagnosis not present

## 2021-01-08 DIAGNOSIS — R059 Cough, unspecified: Secondary | ICD-10-CM | POA: Diagnosis not present

## 2021-01-08 MED ORDER — BENZONATATE 200 MG PO CAPS: 200.0000 mg | ORAL_CAPSULE | Freq: Two times a day (BID) | ORAL | 0 refills | Status: DC | PRN

## 2021-01-08 NOTE — Progress Notes (Signed)
Subjective:    Patient ID: Jill Webb, female    DOB: 04-24-1966, 55 y.o.   MRN: [id number]  HPI Pt is a 55 yo female who presents to the clinic with cough for the last 6 weeks. Seems to be worse in morning and better throughout the day. Some minimal phelgm in the mornings. Activity makes her cough worse. No fever, chills, sinus pressure, ear pain, SOB, wheezing. Her lungs just feel "tight" at times. Seemed to just never completely resolve from covid infection in early July. She has bunion removal surgery next week and wants to make sure everything is ok. She has already stopped all anti-histamines and NSAIDs due to surgery.   .. Active Ambulatory Problems    Diagnosis Date Noted   History of Helicobacter pylori infection 08/26/2015   Vitamin D insufficiency 08/26/2015   Tonsil asymmetry 08/26/2015   Patchy loss of hair 05/08/2016   Adjustment disorder with mixed anxiety and depressed mood 05/08/2016   Fatigue 05/08/2016   Tonsillar mass 09/30/2016   Hyperlipidemia 10/02/2016   DDD (degenerative disc disease), cervical 10/02/2016   Loss of eyelashes, right 01/22/2017   Anxiety and depression 01/22/2017   Eyes swollen 05/26/2017   Spotting 05/26/2017   Vaginal discharge 05/26/2017   No energy 05/26/2017   Post-menopausal bleeding 05/26/2017   Uterine fibroid 05/28/2017   Endometrial thickening on ultrasound 05/31/2017   Shortness of breath on exertion 07/19/2017   Vitamin D deficiency 07/19/2017   Class 2 obesity due to excess calories without serious comorbidity with body mass index (BMI) of 35.0 to 35.9 in adult 04/08/2018   Depression 04/08/2018   Hot flashes, menopausal 04/08/2018   Brow ptosis, left 05/08/2019   CKD (chronic kidney disease) stage 3, GFR 30-59 ml/min (HCC) 12/25/2019   Elevated LDL cholesterol level 12/25/2019   Dizziness 05/20/2020   Right lumbar radiculitis 06/26/2020   DDD (degenerative disc disease), lumbar 07/29/2020   Acute pain of right knee  11/14/2020   Hot flashes 11/14/2020   Resolved Ambulatory Problems    Diagnosis Date Noted   No Resolved Ambulatory Problems   Past Medical History:  Diagnosis Date   Anxiety     Review of Systems See HPI.     Objective:   Physical Exam Vitals reviewed.  Constitutional:      Appearance: Normal appearance.  HENT:     Head: Normocephalic.     Right Ear: Tympanic membrane and ear canal normal.     Left Ear: Tympanic membrane and ear canal normal.     Nose: Nose normal.     Mouth/Throat:     Mouth: Mucous membranes are moist.  Eyes:     Extraocular Movements: Extraocular movements intact.     Pupils: Pupils are equal, round, and reactive to light.  Cardiovascular:     Rate and Rhythm: Normal rate and regular rhythm.     Pulses: Normal pulses.     Heart sounds: Normal heart sounds.  Pulmonary:     Effort: Pulmonary effort is normal.     Breath sounds: Normal breath sounds. No wheezing or rhonchi.  Musculoskeletal:     Cervical back: No tenderness.     Right lower leg: No edema.     Left lower leg: No edema.  Lymphadenopathy:     Cervical: No cervical adenopathy.  Neurological:     Mental Status: She is alert.  Psychiatric:        Mood and Affect: Mood normal.  Assessment & Plan:  Marland KitchenMarland KitchenSherronda was seen today for cough.  Diagnoses and all orders for this visit:  Post-viral cough syndrome -     benzonatate (TESSALON) 200 MG capsule; Take 1 capsule (200 mg total) by mouth 2 (two) times daily as needed for cough. -     DG Chest 2 View; Future  Post-COVID chronic cough -     benzonatate (TESSALON) 200 MG capsule; Take 1 capsule (200 mg total) by mouth 2 (two) times daily as needed for cough. -     DG Chest 2 View; Future  Exam with no concerns.  Peak flow was decreased showing some obstructive pathology. Likely some reactive airway. Pt cannot do prednisone due to upcoming surgery.  No signs of bacterial infection.  Use albuterol inhaler as needed for  cough and SOB.  Tessalon pearls as needed.  CXR ordered to confirmed lungs are clear.  Consider ICS inhaler if cough persist.  After surgery resume anti-histamine and flonase.  Discussed good lung exercises.  Follow up if not improving or worsening.

## 2021-01-08 NOTE — Progress Notes (Signed)
Lungs are clear and look great.

## 2021-01-15 DIAGNOSIS — Z885 Allergy status to narcotic agent status: Secondary | ICD-10-CM | POA: Diagnosis not present

## 2021-01-15 DIAGNOSIS — Z79899 Other long term (current) drug therapy: Secondary | ICD-10-CM | POA: Diagnosis not present

## 2021-01-15 DIAGNOSIS — Z880 Allergy status to penicillin: Secondary | ICD-10-CM | POA: Diagnosis not present

## 2021-01-15 DIAGNOSIS — M2012 Hallux valgus (acquired), left foot: Secondary | ICD-10-CM | POA: Diagnosis not present

## 2021-01-15 DIAGNOSIS — Z91018 Allergy to other foods: Secondary | ICD-10-CM | POA: Diagnosis not present

## 2021-01-28 DIAGNOSIS — Z4789 Encounter for other orthopedic aftercare: Secondary | ICD-10-CM | POA: Diagnosis not present

## 2021-02-18 ENCOUNTER — Ambulatory Visit (INDEPENDENT_AMBULATORY_CARE_PROVIDER_SITE_OTHER): Payer: BC Managed Care – PPO | Admitting: Physician Assistant

## 2021-02-18 ENCOUNTER — Other Ambulatory Visit: Payer: Self-pay

## 2021-02-18 VITALS — BP 124/79 | HR 90 | Ht 68.0 in | Wt 227.0 lb

## 2021-02-18 DIAGNOSIS — Z23 Encounter for immunization: Secondary | ICD-10-CM

## 2021-02-18 DIAGNOSIS — M5416 Radiculopathy, lumbar region: Secondary | ICD-10-CM

## 2021-02-18 DIAGNOSIS — R21 Rash and other nonspecific skin eruption: Secondary | ICD-10-CM | POA: Diagnosis not present

## 2021-02-18 DIAGNOSIS — R748 Abnormal levels of other serum enzymes: Secondary | ICD-10-CM | POA: Diagnosis not present

## 2021-02-18 LAB — HEPATIC FUNCTION PANEL
AG Ratio: 1.7 (calc) (ref 1.0–2.5)
ALT: 16 U/L (ref 6–29)
AST: 17 U/L (ref 10–35)
Albumin: 4.4 g/dL (ref 3.6–5.1)
Alkaline phosphatase (APISO): 73 U/L (ref 37–153)
Bilirubin, Direct: 0.1 mg/dL (ref 0.0–0.2)
Globulin: 2.6 g/dL (calc) (ref 1.9–3.7)
Indirect Bilirubin: 0.3 mg/dL (calc) (ref 0.2–1.2)
Total Bilirubin: 0.4 mg/dL (ref 0.2–1.2)
Total Protein: 7 g/dL (ref 6.1–8.1)

## 2021-02-18 MED ORDER — TRIAMCINOLONE ACETONIDE 0.1 % EX CREA: 1.0000 "application " | TOPICAL_CREAM | Freq: Two times a day (BID) | CUTANEOUS | 0 refills | Status: DC

## 2021-02-18 MED ORDER — CYCLOBENZAPRINE HCL 10 MG PO TABS: 10.0000 mg | ORAL_TABLET | Freq: Three times a day (TID) | ORAL | 1 refills | Status: DC | PRN

## 2021-02-18 MED ORDER — HYDROXYZINE PAMOATE 25 MG PO CAPS: 25.0000 mg | ORAL_CAPSULE | Freq: Three times a day (TID) | ORAL | 0 refills | Status: DC | PRN

## 2021-02-18 MED ORDER — METHYLPREDNISOLONE SODIUM SUCC 125 MG IJ SOLR
125.0000 mg | Freq: Once | INTRAMUSCULAR | Status: AC
  Administered 2021-02-18: 125 mg via INTRAMUSCULAR

## 2021-02-18 NOTE — Progress Notes (Signed)
Subjective:    Patient ID: Jill Webb, female    DOB: February 13, 1966, 55 y.o.   MRN: [id number]  HPI Pt is a 55 yo female who presents to the clinic with rash all over body that is itchy for the last week. She denies any new medications, detergents, lotions, environments. Noone else around has any. Not had anything to make better. No fever, chills, URI, sinus symptoms.    . Active Ambulatory Problems    Diagnosis Date Noted   History of Helicobacter pylori infection 08/26/2015   Vitamin D insufficiency 08/26/2015   Tonsil asymmetry 08/26/2015   Patchy loss of hair 05/08/2016   Adjustment disorder with mixed anxiety and depressed mood 05/08/2016   Fatigue 05/08/2016   Tonsillar mass 09/30/2016   Hyperlipidemia 10/02/2016   DDD (degenerative disc disease), cervical 10/02/2016   Loss of eyelashes, right 01/22/2017   Anxiety and depression 01/22/2017   Eyes swollen 05/26/2017   Spotting 05/26/2017   Vaginal discharge 05/26/2017   No energy 05/26/2017   Post-menopausal bleeding 05/26/2017   Uterine fibroid 05/28/2017   Endometrial thickening on ultrasound 05/31/2017   Shortness of breath on exertion 07/19/2017   Vitamin D deficiency 07/19/2017   Class 2 obesity due to excess calories without serious comorbidity with body mass index (BMI) of 35.0 to 35.9 in adult 04/08/2018   Depression 04/08/2018   Hot flashes, menopausal 04/08/2018   Brow ptosis, left 05/08/2019   CKD (chronic kidney disease) stage 3, GFR 30-59 ml/min (HCC) 12/25/2019   Elevated LDL cholesterol level 12/25/2019   Dizziness 05/20/2020   Right lumbar radiculitis 06/26/2020   DDD (degenerative disc disease), lumbar 07/29/2020   Acute pain of right knee 11/14/2020   Hot flashes 11/14/2020   Rash and nonspecific skin eruption 02/19/2021   Resolved Ambulatory Problems    Diagnosis Date Noted   No Resolved Ambulatory Problems   Past Medical History:  Diagnosis Date   Anxiety      Review of Systems See  HPI.     Objective:   Physical Exam Vitals reviewed.  Constitutional:      Appearance: Normal appearance.  HENT:     Head: Normocephalic.  Cardiovascular:     Rate and Rhythm: Normal rate and regular rhythm.     Pulses: Normal pulses.     Heart sounds: Normal heart sounds.  Pulmonary:     Effort: Pulmonary effort is normal.     Breath sounds: Normal breath sounds.  Skin:    Comments: Erythematous papules diffusely over bilateral arms and legs and trunk. No scales, vesicles.   Neurological:     General: No focal deficit present.     Mental Status: She is alert and oriented to person, place, and time.  Psychiatric:        Mood and Affect: Mood normal.          Assessment & Plan:  Marland KitchenMarland KitchenArilynn was seen today for follow-up.  Diagnoses and all orders for this visit:  Rash and nonspecific skin eruption -     hydrOXYzine (VISTARIL) 25 MG capsule; Take 1 capsule (25 mg total) by mouth 3 (three) times daily as needed. -     triamcinolone cream (KENALOG) 0.1 %; Apply 1 application topically 2 (two) times daily. -     methylPREDNISolone sodium succinate (SOLU-MEDROL) 125 mg/2 mL injection 125 mg  Right lumbar radiculitis -     cyclobenzaprine (FLEXERIL) 10 MG tablet; Take 1 tablet (10 mg total) by mouth 3 (three) times daily  as needed for muscle spasms.  Flu vaccine need -     Flu Vaccine QUAD 23mo+IM (Fluarix, Fluzone & Alfiuria Quad PF)  Appears like a contact dermatitis. Solumedrol and topical triamcinolone sent. Watch for them to come back.   Flexeril refilled for as needed back pain.

## 2021-02-18 NOTE — Patient Instructions (Signed)
Contact Dermatitis Dermatitis is redness, soreness, and swelling (inflammation) of the skin. Contact dermatitis is a reaction to certain substances that touch the skin. Many different substances can cause contact dermatitis. There are two types of contact dermatitis: Irritant contact dermatitis. This type is caused by something that irritates your skin, such as having dry hands from washing them too often with soap. This type does not require previous exposure to the substance for a reaction to occur. This is the most common type. Allergic contact dermatitis. This type is caused by a substance that you are allergic to, such as poison ivy. This type occurs when you have been exposed to the substance (allergen) and develop a sensitivity to it. Dermatitis may develop soon after your first exposure to the allergen, or it may not develop until the next time you are exposed and every time thereafter. What are the causes? Irritant contact dermatitis is most commonly caused by exposure to: Makeup. Soaps. Detergents. Bleaches. Acids. Metal salts, such as nickel. Allergic contact dermatitis is most commonly caused by exposure to: Poisonous plants. Chemicals. Jewelry. Latex. Medicines. Preservatives in products, such as clothing. What increases the risk? You are more likely to develop this condition if you have: A job that exposes you to irritants or allergens. Certain medical conditions, such as asthma or eczema. What are the signs or symptoms? Symptoms of this condition may occur on your body anywhere the irritant has touched you or is touched by you. Symptoms include: Dryness or flaking. Redness. Cracks. Itching. Pain or a burning feeling. Blisters. Drainage of small amounts of blood or clear fluid from skin cracks. With allergic contact dermatitis, there may also be swelling in areas such asthe eyelids, mouth, or genitals. How is this diagnosed? This condition is diagnosed with a medical  history and physical exam. A patch skin test may be performed to help determine the cause. If the condition is related to your job, you may need to see an occupational medicine specialist. How is this treated? This condition is treated by checking for the cause of the reaction and protecting your skin from further contact. Treatment may also include: Steroid creams or ointments. Oral steroid medicines may be needed in more severe cases. Antibiotic medicines or antibacterial ointments, if a skin infection is present. Antihistamine lotion or an antihistamine taken by mouth to ease itching. A bandage (dressing). Follow these instructions at home: Skin care Moisturize your skin as needed. Apply cool compresses to the affected areas. Try applying baking soda paste to your skin. Stir water into baking soda until it reaches a paste-like consistency. Do not scratch your skin, and avoid friction to the affected area. Avoid the use of soaps, perfumes, and dyes. Medicines Take or apply over-the-counter and prescription medicines only as told by your health care provider. If you were prescribed an antibiotic medicine, take or apply the antibiotic as told by your health care provider. Do not stop using the antibiotic even if your condition improves. Bathing Try taking a bath with: Epsom salts. Follow the instructions on the packaging. You can get these at your local pharmacy or grocery store. Baking soda. Pour a small amount into the bath as directed by your health care provider. Colloidal oatmeal. Follow the instructions on the packaging. You can get this at your local pharmacy or grocery store. Bathe less frequently, such as every other day. Bathe in lukewarm water. Avoid using hot water. Bandage care If you were given a bandage (dressing), change it as told by   your health care provider. Wash your hands with soap and water before and after you change your dressing. If soap and water are not  available, use hand sanitizer. General instructions Avoid the substance that caused your reaction. If you do not know what caused it, keep a journal to try to track what caused it. Write down: What you eat. What cosmetic products you use. What you drink. What you wear in the affected area. This includes jewelry. Check the affected areas every day for signs of infection. Check for: More redness, swelling, or pain. More fluid or blood. Warmth. Pus or a bad smell. Keep all follow-up visits as told by your health care provider. This is important. Contact a health care provider if: Your condition does not improve with treatment. Your condition gets worse. You have signs of infection such as swelling, tenderness, redness, soreness, or warmth in the affected area. You have a fever. You have new symptoms. Get help right away if: You have a severe headache, neck pain, or neck stiffness. You vomit. You feel very sleepy. You notice red streaks coming from the affected area. Your bone or joint underneath the affected area becomes painful after the skin has healed. The affected area turns darker. You have difficulty breathing. Summary Dermatitis is redness, soreness, and swelling (inflammation) of the skin. Contact dermatitis is a reaction to certain substances that touch the skin. Symptoms of this condition may occur on your body anywhere the irritant has touched you or is touched by you. This condition is treated by figuring out what caused the reaction and protecting your skin from further contact. Treatment may also include medicines and skin care. Avoid the substance that caused your reaction. If you do not know what caused it, keep a journal to try to track what caused it. Contact a health care provider if your condition gets worse or you have signs of infection such as swelling, tenderness, redness, soreness, or warmth in the affected area. This information is not intended to replace  advice given to you by your health care provider. Make sure you discuss any questions you have with your healthcare provider. Document Revised: 09/07/2018 Document Reviewed: 12/01/2017 Elsevier Patient Education  2022 [employer]  

## 2021-02-19 ENCOUNTER — Encounter: Payer: Self-pay | Admitting: Physician Assistant

## 2021-02-19 DIAGNOSIS — R21 Rash and other nonspecific skin eruption: Secondary | ICD-10-CM | POA: Insufficient documentation

## 2021-02-19 NOTE — Progress Notes (Signed)
Liver enzymes back in normal range! Labs look great.

## 2021-02-21 DIAGNOSIS — Z4789 Encounter for other orthopedic aftercare: Secondary | ICD-10-CM | POA: Diagnosis not present

## 2021-05-02 DIAGNOSIS — T84498D Other mechanical complication of other internal orthopedic devices, implants and grafts, subsequent encounter: Secondary | ICD-10-CM | POA: Diagnosis not present

## 2021-05-02 DIAGNOSIS — E669 Obesity, unspecified: Secondary | ICD-10-CM | POA: Diagnosis not present

## 2021-05-02 DIAGNOSIS — T84038A Mechanical loosening of other internal prosthetic joint, initial encounter: Secondary | ICD-10-CM | POA: Diagnosis not present

## 2021-05-02 DIAGNOSIS — Z6834 Body mass index (BMI) 34.0-34.9, adult: Secondary | ICD-10-CM | POA: Diagnosis not present

## 2021-06-09 ENCOUNTER — Encounter: Payer: Self-pay | Admitting: Physician Assistant

## 2021-06-09 DIAGNOSIS — F32A Depression, unspecified: Secondary | ICD-10-CM

## 2021-06-09 MED ORDER — LORAZEPAM 0.5 MG PO TABS: 0.5000 mg | ORAL_TABLET | Freq: Three times a day (TID) | ORAL | 1 refills | Status: DC | PRN

## 2021-08-22 ENCOUNTER — Other Ambulatory Visit: Payer: Self-pay | Admitting: Physician Assistant

## 2021-08-22 DIAGNOSIS — F419 Anxiety disorder, unspecified: Secondary | ICD-10-CM

## 2021-08-22 DIAGNOSIS — M5030 Other cervical disc degeneration, unspecified cervical region: Secondary | ICD-10-CM

## 2021-08-22 DIAGNOSIS — R5383 Other fatigue: Secondary | ICD-10-CM

## 2021-08-27 MED ORDER — BUPROPION HCL ER (XL) 150 MG PO TB24: 150.0000 mg | ORAL_TABLET | ORAL | 0 refills | Status: DC

## 2021-08-27 MED ORDER — MELOXICAM 15 MG PO TABS: 15.0000 mg | ORAL_TABLET | Freq: Every day | ORAL | 0 refills | Status: DC | PRN

## 2021-08-27 MED ORDER — GABAPENTIN 300 MG PO CAPS: 300.0000 mg | ORAL_CAPSULE | Freq: Three times a day (TID) | ORAL | 0 refills | Status: DC

## 2021-09-01 ENCOUNTER — Ambulatory Visit: Payer: BC Managed Care – PPO | Admitting: Physician Assistant

## 2021-09-02 ENCOUNTER — Ambulatory Visit (INDEPENDENT_AMBULATORY_CARE_PROVIDER_SITE_OTHER): Payer: BC Managed Care – PPO | Admitting: Physician Assistant

## 2021-09-02 ENCOUNTER — Encounter: Payer: Self-pay | Admitting: Physician Assistant

## 2021-09-02 VITALS — BP 126/74 | HR 78 | Ht 68.0 in | Wt 212.0 lb

## 2021-09-02 DIAGNOSIS — Z1211 Encounter for screening for malignant neoplasm of colon: Secondary | ICD-10-CM | POA: Diagnosis not present

## 2021-09-02 DIAGNOSIS — R5383 Other fatigue: Secondary | ICD-10-CM | POA: Diagnosis not present

## 2021-09-02 DIAGNOSIS — F32A Depression, unspecified: Secondary | ICD-10-CM

## 2021-09-02 DIAGNOSIS — L819 Disorder of pigmentation, unspecified: Secondary | ICD-10-CM

## 2021-09-02 DIAGNOSIS — F419 Anxiety disorder, unspecified: Secondary | ICD-10-CM

## 2021-09-02 DIAGNOSIS — L859 Epidermal thickening, unspecified: Secondary | ICD-10-CM

## 2021-09-02 MED ORDER — LORAZEPAM 0.5 MG PO TABS: 0.5000 mg | ORAL_TABLET | Freq: Three times a day (TID) | ORAL | 1 refills | Status: DC | PRN

## 2021-09-02 MED ORDER — CLOTRIMAZOLE-BETAMETHASONE 1-0.05 % EX CREA: 1.0000 "application " | TOPICAL_CREAM | Freq: Two times a day (BID) | CUTANEOUS | 0 refills | Status: DC

## 2021-09-02 MED ORDER — BUPROPION HCL ER (XL) 150 MG PO TB24: 150.0000 mg | ORAL_TABLET | ORAL | 0 refills | Status: DC

## 2021-09-02 NOTE — Progress Notes (Signed)
? ?Subjective:  ? ? Patient ID: Jill Webb, female    DOB: 12/19/1965, 56 y.o.   MRN: [id number] ? ?HPI ?Pt is a 56 yo female who presents to the clinic for medication refills and skin concern.  ? ?Pt is on wellbutrin and ativan for anxiety and depression. She has no concerns. She denies any SI/HC. She is taking ativan sparingly.  ? ?She noticed a dark spot with some central scaling on the bottom to lateral side of her left foot since a massage on march 10th. She noticed the discoloration first then the rough scales. She has been using triamcinolone cream but no improvement. It itches a little but no pain. Never had this before.  ? ?.. ?Active Ambulatory Problems  ?  Diagnosis Date Noted  ? History of Helicobacter pylori infection 08/26/2015  ? Vitamin D insufficiency 08/26/2015  ? Tonsil asymmetry 08/26/2015  ? Patchy loss of hair 05/08/2016  ? Adjustment disorder with mixed anxiety and depressed mood 05/08/2016  ? Fatigue 05/08/2016  ? Tonsillar mass 09/30/2016  ? Hyperlipidemia 10/02/2016  ? DDD (degenerative disc disease), cervical 10/02/2016  ? Loss of eyelashes, right 01/22/2017  ? Anxiety and depression 01/22/2017  ? Eyes swollen 05/26/2017  ? Spotting 05/26/2017  ? Vaginal discharge 05/26/2017  ? No energy 05/26/2017  ? Post-menopausal bleeding 05/26/2017  ? Uterine fibroid 05/28/2017  ? Endometrial thickening on ultrasound 05/31/2017  ? Shortness of breath on exertion 07/19/2017  ? Vitamin D deficiency 07/19/2017  ? Class 2 obesity due to excess calories without serious comorbidity with body mass index (BMI) of 35.0 to 35.9 in adult 04/08/2018  ? Depression 04/08/2018  ? Hot flashes, menopausal 04/08/2018  ? Brow ptosis, left 05/08/2019  ? CKD (chronic kidney disease) stage 3, GFR 30-59 ml/min (HCC) 12/25/2019  ? Elevated LDL cholesterol level 12/25/2019  ? Dizziness 05/20/2020  ? Right lumbar radiculitis 06/26/2020  ? DDD (degenerative disc disease), lumbar 07/29/2020  ? Acute pain of right knee  11/14/2020  ? Hot flashes 11/14/2020  ? Rash and nonspecific skin eruption 02/19/2021  ? Hyperkeratosis 09/03/2021  ? Discoloration of skin of toe 09/03/2021  ? ?Resolved Ambulatory Problems  ?  Diagnosis Date Noted  ? No Resolved Ambulatory Problems  ? ?Past Medical History:  ?Diagnosis Date  ? Anxiety   ? ? ? ? ?Review of Systems ?See HPI.  ?   ?Objective:  ? Physical Exam ?Vitals reviewed.  ?Constitutional:   ?   Appearance: Normal appearance. She is obese.  ?HENT:  ?   Head: Normocephalic.  ?Cardiovascular:  ?   Rate and Rhythm: Normal rate and regular rhythm.  ?   Pulses: Normal pulses.  ?   Heart sounds: Normal heart sounds.  ?Pulmonary:  ?   Effort: Pulmonary effort is normal.  ?   Breath sounds: Normal breath sounds.  ?Musculoskeletal:  ?   Right lower leg: No edema.  ?   Left lower leg: No edema.  ?Skin: ?   Comments: Left lateral toe under the great toe crease area of nickel size hyperpigmentation with central rough area with scaling and slightly raised. No erythema, warmth, or tenderness.   ?Neurological:  ?   General: No focal deficit present.  ?   Mental Status: She is alert and oriented to person, place, and time.  ?Psychiatric:     ?   Mood and Affect: Mood normal.  ? ?.. ? ?  09/02/2021  ?  3:12 PM 02/18/2021  ?  8:48  AM 11/11/2020  ?  8:37 AM 05/17/2020  ?  9:43 AM 12/22/2019  ?  8:50 AM  ?Depression screen PHQ 2/9  ?Decreased Interest 0 0 0 0 0  ?Down, Depressed, Hopeless 0 0 0 0 0  ?PHQ - 2 Score 0 0 0 0 0  ?Altered sleeping 0 0 '2 1 2  '$ ?Tired, decreased energy 0 '1 1 1 '$ 0  ?Change in appetite 0 0 0 2 0  ?Feeling bad or failure about yourself  0 0 0 0 0  ?Trouble concentrating 0 0 0 0 0  ?Moving slowly or fidgety/restless 0 0 0 0 0  ?Suicidal thoughts 0 0 0 0 0  ?PHQ-9 Score 0 '1 3 4 2  '$ ?Difficult doing work/chores Not difficult at all Not difficult at all Not difficult at all Not difficult at all Not difficult at all  ? ?.. ? ?  09/02/2021  ?  3:13 PM 02/18/2021  ?  8:48 AM 11/11/2020  ?  8:38 AM 05/17/2020   ?  9:43 AM  ?GAD 7 : Generalized Anxiety Score  ?Nervous, Anxious, on Edge 0 '1 1 1  '$ ?Control/stop worrying 0 0 0 0  ?Worry too much - different things 0 0 0 0  ?Trouble relaxing 0 0 0 0  ?Restless 0 0 0 0  ?Easily annoyed or irritable '1 1 2 '$ 0  ?Afraid - awful might happen 0 0 0 0  ?Total GAD 7 Score '1 2 3 1  '$ ?Anxiety Difficulty Not difficult at all Not difficult at all Not difficult at all Not difficult at all  ? ? ? ? ? ? ? ?   ?Assessment & Plan:  ? ?Marland KitchenMarland KitchenShuntay was seen today for follow-up. ? ?Diagnoses and all orders for this visit: ? ?Anxiety and depression ?-     LORazepam (ATIVAN) 0.5 MG tablet; Take 1 tablet (0.5 mg total) by mouth every 8 (eight) hours as needed. for anxiety ?-     buPROPion (WELLBUTRIN XL) 150 MG 24 hr tablet; Take 1 tablet (150 mg total) by mouth every morning. ? ?Colon cancer screening ?-     Cologuard ? ?No energy ?-     buPROPion (WELLBUTRIN XL) 150 MG 24 hr tablet; Take 1 tablet (150 mg total) by mouth every morning. ? ?Discoloration of skin of toe ?-     clotrimazole-betamethasone (LOTRISONE) cream; Apply 1 application. topically 2 (two) times daily. ? ?Hyperkeratosis ?-     clotrimazole-betamethasone (LOTRISONE) cream; Apply 1 application. topically 2 (two) times daily. ? ? ?PHQ/GAD numbers to goal.  ?Wellbutrin and ativan refilled.pt aware to only use ativan sparingly as needed.  ? ?Cologuard ordered for screening.  ? ?Unclear etiology of scaling and discoloration. Reassuring no pain. Pt has great sensation and feeling of feet.  ?Discussed epson water feet soaks and a gentle pummel stone with application of lortisone for the next 2 weeks then follow up. ? Fungal.  ? ?

## 2021-09-03 DIAGNOSIS — L819 Disorder of pigmentation, unspecified: Secondary | ICD-10-CM | POA: Insufficient documentation

## 2021-09-03 DIAGNOSIS — L859 Epidermal thickening, unspecified: Secondary | ICD-10-CM | POA: Insufficient documentation

## 2021-09-19 DIAGNOSIS — Z1211 Encounter for screening for malignant neoplasm of colon: Secondary | ICD-10-CM | POA: Diagnosis not present

## 2021-09-26 IMAGING — DX DG CHEST 2V
2 series · 2 of 2 positions shown · non-contrast
Comparison: March 15, 2017.

CLINICAL DATA: Cough, 0N1L1-F2.

EXAM:
CHEST - 2 VIEW

[chest pa]
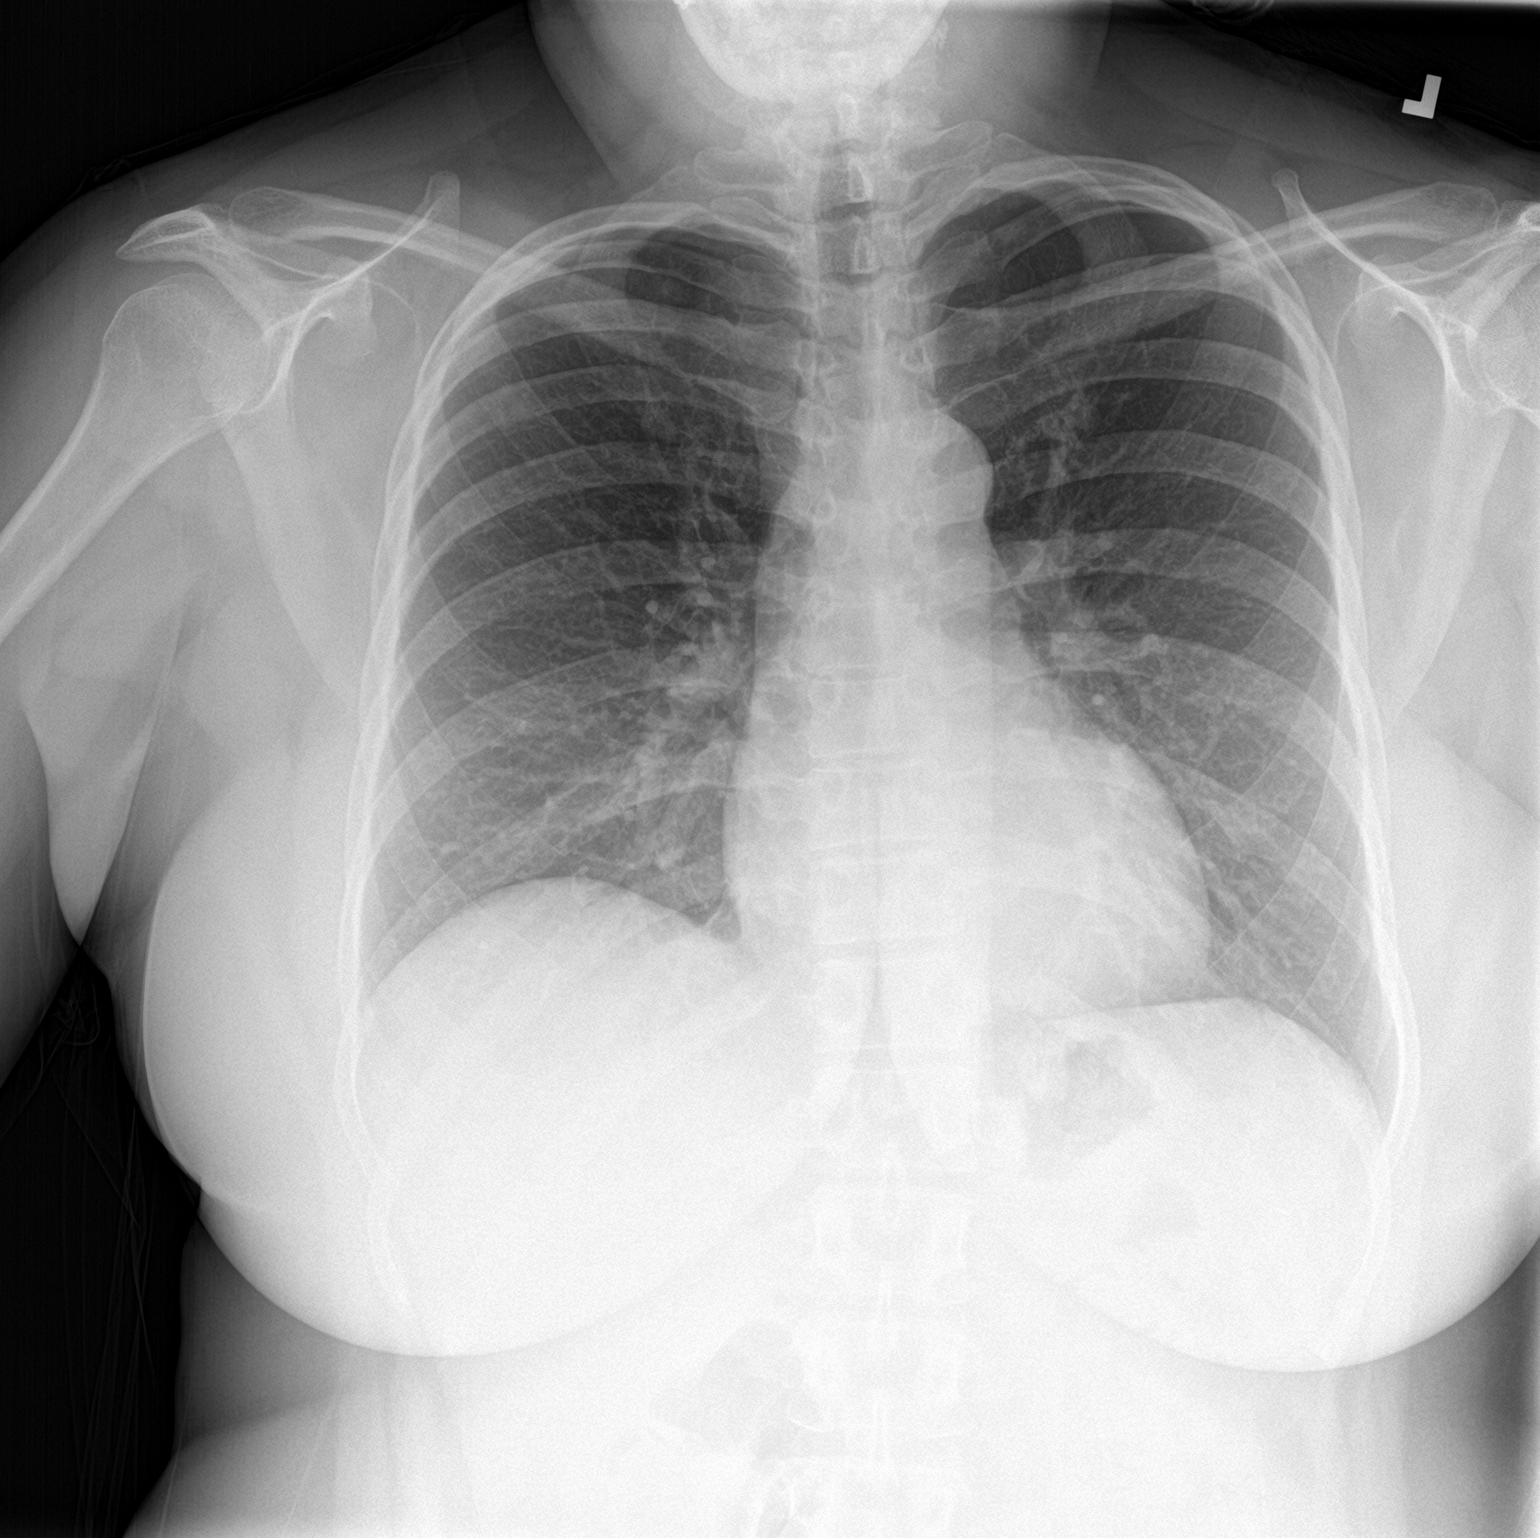

[chest lat]
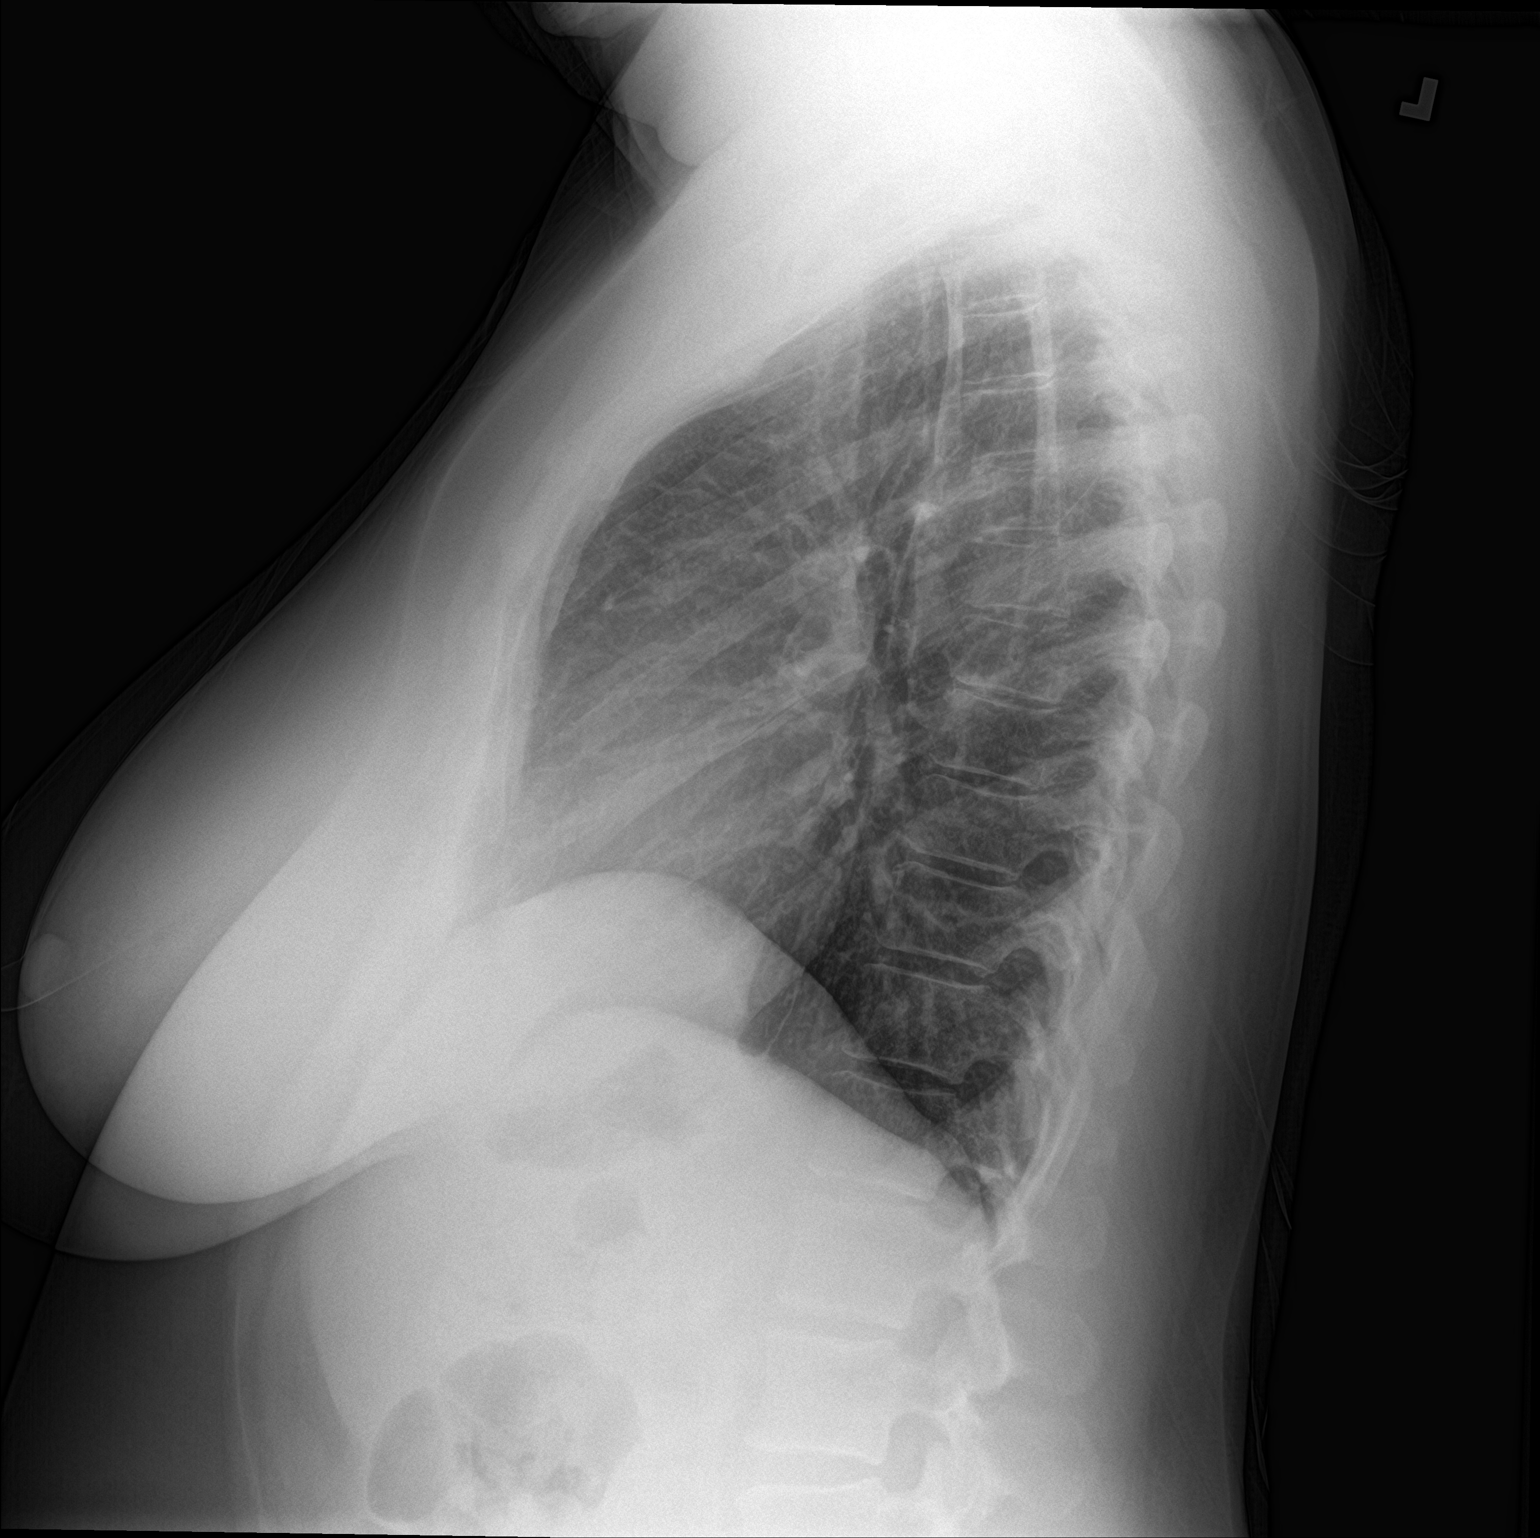

[2 of 2 positions shown; findings below may reference images not displayed]

FINDINGS: The heart size and mediastinal contours are within normal limits.
Both lungs are clear. The visualized skeletal structures are
unremarkable.
IMPRESSION: No active cardiopulmonary disease.

## 2021-09-27 LAB — COLOGUARD: COLOGUARD: NEGATIVE

## 2021-09-29 NOTE — Progress Notes (Signed)
Negative cologuard. Repeat in 3 years

## 2021-11-04 ENCOUNTER — Other Ambulatory Visit: Payer: Self-pay | Admitting: Physician Assistant

## 2021-11-04 DIAGNOSIS — Z1231 Encounter for screening mammogram for malignant neoplasm of breast: Secondary | ICD-10-CM

## 2021-12-25 ENCOUNTER — Ambulatory Visit (INDEPENDENT_AMBULATORY_CARE_PROVIDER_SITE_OTHER): Payer: BC Managed Care – PPO

## 2021-12-25 DIAGNOSIS — Z1231 Encounter for screening mammogram for malignant neoplasm of breast: Secondary | ICD-10-CM | POA: Diagnosis not present

## 2021-12-28 NOTE — Progress Notes (Signed)
Normal mammogram. Follow up in 1 year.

## 2022-01-09 DIAGNOSIS — H524 Presbyopia: Secondary | ICD-10-CM | POA: Diagnosis not present

## 2022-01-09 DIAGNOSIS — H52223 Regular astigmatism, bilateral: Secondary | ICD-10-CM | POA: Diagnosis not present

## 2022-01-09 DIAGNOSIS — H5213 Myopia, bilateral: Secondary | ICD-10-CM | POA: Diagnosis not present

## 2022-02-10 ENCOUNTER — Encounter: Payer: Self-pay | Admitting: Physician Assistant

## 2022-02-10 ENCOUNTER — Ambulatory Visit (INDEPENDENT_AMBULATORY_CARE_PROVIDER_SITE_OTHER): Payer: BC Managed Care – PPO | Admitting: Physician Assistant

## 2022-02-10 VITALS — BP 134/75 | HR 105 | Ht 68.0 in | Wt 240.0 lb

## 2022-02-10 DIAGNOSIS — Z13220 Encounter for screening for lipoid disorders: Secondary | ICD-10-CM | POA: Diagnosis not present

## 2022-02-10 DIAGNOSIS — F32A Depression, unspecified: Secondary | ICD-10-CM

## 2022-02-10 DIAGNOSIS — Z131 Encounter for screening for diabetes mellitus: Secondary | ICD-10-CM | POA: Diagnosis not present

## 2022-02-10 DIAGNOSIS — Z23 Encounter for immunization: Secondary | ICD-10-CM | POA: Diagnosis not present

## 2022-02-10 DIAGNOSIS — Z0000 Encounter for general adult medical examination without abnormal findings: Secondary | ICD-10-CM | POA: Diagnosis not present

## 2022-02-10 DIAGNOSIS — R5383 Other fatigue: Secondary | ICD-10-CM

## 2022-02-10 DIAGNOSIS — Z6836 Body mass index (BMI) 36.0-36.9, adult: Secondary | ICD-10-CM

## 2022-02-10 DIAGNOSIS — E6609 Other obesity due to excess calories: Secondary | ICD-10-CM

## 2022-02-10 DIAGNOSIS — M5030 Other cervical disc degeneration, unspecified cervical region: Secondary | ICD-10-CM

## 2022-02-10 DIAGNOSIS — F419 Anxiety disorder, unspecified: Secondary | ICD-10-CM

## 2022-02-10 DIAGNOSIS — E559 Vitamin D deficiency, unspecified: Secondary | ICD-10-CM

## 2022-02-10 MED ORDER — ESCITALOPRAM OXALATE 10 MG PO TABS: 10.0000 mg | ORAL_TABLET | Freq: Every day | ORAL | 3 refills | Status: DC

## 2022-02-10 MED ORDER — BUPROPION HCL ER (XL) 150 MG PO TB24: 150.0000 mg | ORAL_TABLET | ORAL | 3 refills | Status: DC

## 2022-02-10 MED ORDER — GABAPENTIN 300 MG PO CAPS
300.0000 mg | ORAL_CAPSULE | Freq: Three times a day (TID) | ORAL | 3 refills | Status: DC
Start: 2022-02-10 — End: 2022-08-11

## 2022-02-10 MED ORDER — METHOCARBAMOL 500 MG PO TABS: 500.0000 mg | ORAL_TABLET | Freq: Three times a day (TID) | ORAL | 0 refills | Status: DC

## 2022-02-10 MED ORDER — MELOXICAM 15 MG PO TABS: 15.0000 mg | ORAL_TABLET | Freq: Every day | ORAL | 1 refills | Status: DC | PRN

## 2022-02-10 NOTE — Progress Notes (Signed)
Complete physical exam  Patient: Jill Webb   DOB: 08-11-1965   56 y.o. Female  MRN: [id number]  Subjective:    Chief Complaint  Patient presents with   Annual Exam    Jill Webb is a 56 y.o. female who presents today for a complete physical exam. She reports consuming a general diet. The patient does not participate in regular exercise at present. She generally feels well. She reports sleeping well. She does not have additional problems to discuss today.    Most recent fall risk assessment:    02/10/2022    3:42 PM  [place] in the past year? 0  Number falls in past yr: 0  Injury with Fall? 0  Risk for fall due to : No Fall Risks  Follow up Falls evaluation completed     Most recent depression screenings:    02/10/2022    3:41 PM 09/02/2021    3:12 PM  PHQ 2/9 Scores  PHQ - 2 Score 2 0  PHQ- 9 Score 6 0    Vision:Within last year and Dental: No current dental problems  Patient Active Problem List   Diagnosis Date Noted   Hyperkeratosis 09/03/2021   Discoloration of skin of toe 09/03/2021   Rash and nonspecific skin eruption 02/19/2021   Acute pain of right knee 11/14/2020   Hot flashes 11/14/2020   DDD (degenerative disc disease), lumbar 07/29/2020   Right lumbar radiculitis 06/26/2020   Dizziness 05/20/2020   CKD (chronic kidney disease) stage 3, GFR 30-59 ml/min (HCC) 12/25/2019   Elevated LDL cholesterol level 12/25/2019   Brow ptosis, left 05/08/2019   Class 2 obesity due to excess calories without serious comorbidity with body mass index (BMI) of 36.0 to 36.9 in adult 04/08/2018   Depression 04/08/2018   Hot flashes, menopausal 04/08/2018   Shortness of breath on exertion 07/19/2017   Vitamin D deficiency 07/19/2017   Endometrial thickening on ultrasound 05/31/2017   Uterine fibroid 05/28/2017   Eyes swollen 05/26/2017   Spotting 05/26/2017   Vaginal discharge 05/26/2017   No energy 05/26/2017   Post-menopausal bleeding  05/26/2017   Loss of eyelashes, right 01/22/2017   Anxiety and depression 01/22/2017   Hyperlipidemia 10/02/2016   DDD (degenerative disc disease), cervical 10/02/2016   Tonsillar mass 09/30/2016   Patchy loss of hair 05/08/2016   Adjustment disorder with mixed anxiety and depressed mood 05/08/2016   Fatigue 21/30/8657   History of Helicobacter pylori infection 08/26/2015   Vitamin D insufficiency 08/26/2015   Tonsil asymmetry 08/26/2015   Past Medical History:  Diagnosis Date   Anxiety    Depression    Uterine fibroid    Vitamin D deficiency    Family History  Problem Relation Age of Onset   Hypothyroidism Mother    High Cholesterol Mother    Cancer Sister        breast   Other Father        unsure of medical history   Allergies  Allergen Reactions   Gluten Meal Other (See Comments)    Bloating and diarrhea   Codeine Rash      Patient Care Team: Lavada Mesi as PCP - General (Family Medicine)   Outpatient Medications Prior to Visit  Medication Sig   BLACK COHOSH PO Take by mouth.   buPROPion (WELLBUTRIN XL) 150 MG 24 hr tablet Take 1 tablet (150 mg total) by mouth every morning.   Cholecalciferol (VITAMIN D) 50 MCG (  2000 UT) CAPS Take by mouth.   escitalopram (LEXAPRO) 10 MG tablet Take 1 tablet (10 mg total) by mouth daily.   LORazepam (ATIVAN) 0.5 MG tablet Take 1 tablet (0.5 mg total) by mouth every 8 (eight) hours as needed. for anxiety   [DISCONTINUED] cyclobenzaprine (FLEXERIL) 10 MG tablet Take 1 tablet (10 mg total) by mouth 3 (three) times daily as needed for muscle spasms.   [DISCONTINUED] gabapentin (NEURONTIN) 300 MG capsule Take 1 capsule (300 mg total) by mouth 3 (three) times daily.   [DISCONTINUED] meloxicam (MOBIC) 15 MG tablet Take 1 tablet (15 mg total) by mouth daily as needed for pain.   [DISCONTINUED] albuterol (VENTOLIN HFA) 108 (90 Base) MCG/ACT inhaler Inhale 2 puffs into the lungs every 6 (six) hours as needed.   [DISCONTINUED]  clotrimazole-betamethasone (LOTRISONE) cream Apply 1 application. topically 2 (two) times daily.   [DISCONTINUED] triamcinolone cream (KENALOG) 0.1 % Apply 1 application topically 2 (two) times daily.   No facility-administered medications prior to visit.    ROS   Weight gain/right shoulder pain intermittent/right radiculitis flaring back up     Objective:     BP 134/75   Pulse (!) 105   Ht '5\' 8"'$  (1.727 m)   Wt 240 lb (108.9 kg)   LMP  (LMP Unknown) Comment: Last period sometime in 2017  SpO2 98%   BMI 36.49 kg/m  BP Readings from Last 3 Encounters:  02/10/22 134/75  09/02/21 126/74  02/18/21 124/79   Wt Readings from Last 3 Encounters:  02/10/22 240 lb (108.9 kg)  09/02/21 212 lb (96.2 kg)  02/18/21 227 lb (103 kg)      Physical Exam  BP 134/75   Pulse (!) 105   Ht '5\' 8"'$  (1.727 m)   Wt 240 lb (108.9 kg)   LMP  (LMP Unknown) Comment: Last period sometime in 2017  SpO2 98%   BMI 36.49 kg/m   General Appearance:    Alert, cooperative, obese no distress, appears stated age  Head:    Normocephalic, without obvious abnormality, atraumatic  Eyes:    PERRL, conjunctiva/corneas clear, EOM's intact, fundi    benign, both eyes  Ears:    Normal TM's and external ear canals, both ears  Nose:   Nares normal, septum midline, mucosa normal, no drainage    or sinus tenderness  Throat:   Lips, mucosa, and tongue normal; teeth and gums normal  Neck:   Supple, symmetrical, trachea midline, no adenopathy;    thyroid:  no enlargement/tenderness/nodules; no carotid   bruit or JVD  Back:     Symmetric, no curvature, ROM normal, no CVA tenderness  Lungs:     Clear to auscultation bilaterally, respirations unlabored  Chest Wall:    No tenderness or deformity   Heart:    Regular rate and rhythm, S1 and S2 normal, no murmur, rub   or gallop     Abdomen:     Soft, non-tender, bowel sounds active all four quadrants,    no masses, no organomegaly        Extremities:   Extremities  normal, atraumatic, no cyanosis or edema  Pulses:   2+ and symmetric all extremities  Skin:   Skin color, texture, turgor normal, no rashes or lesions  Lymph nodes:   Cervical, supraclavicular, and axillary nodes normal  Neurologic:   CNII-XII intact, normal strength, sensation and reflexes    throughout    Assessment & Plan:    Routine Health Maintenance and Physical  Exam  Immunization History  Administered Date(s) Administered   Influenza,inj,Quad PF,6+ Mos 05/08/2016, 01/20/2017, 02/18/2021, 02/10/2022   Influenza-Unspecified 03/16/2015, 03/06/2019, 03/14/2020   PFIZER(Purple Top)SARS-COV-2 Vaccination 08/04/2019, 08/26/2019, 05/09/2020   Tdap 06/03/2010, 11/11/2020   Zoster Recombinat (Shingrix) 06/10/2018, 11/07/2018    Health Maintenance  Topic Date Due   COVID-19 Vaccine (4 - Pfizer series) 02/26/2022 (Originally 07/04/2020)   MAMMOGRAM  12/26/2022   PAP SMEAR-Modifier  11/12/2023   Fecal DNA (Cologuard)  09/19/2024   TETANUS/TDAP  11/12/2030   INFLUENZA VACCINE  Completed   Hepatitis C Screening  Completed   HIV Screening  Completed   Zoster Vaccines- Shingrix  Completed   HPV VACCINES  Aged Out   COLONOSCOPY (Pts 45-96yr Insurance coverage will need to be confirmed)  Discontinued    Discussed health benefits of physical activity, and encouraged her to engage in regular exercise appropriate for her age and condition.  .. Discussed 150 minutes of exercise a week.  Encouraged vitamin D 1000 units and Calcium '1300mg'$  or 4 servings of dairy a day.  PHQ no concerns-refilled lexapro and wellbutrin Fasting labs ordered Pap and mammogram UTD Cologuard UTD Shingles vaccine UTD Flu shot given today  Intermittent shoulder and leg pain  Robaxin sent as needed  Follow up if symptoms persist  ..Discussed low carb diet with 1500 calories and 80g of protein.  Exercising at least 150 minutes a week.  [employer]itness Pal could be a gMicrobiologist  Discussed weight gain Pt is  going to start walking and watching what she is eating Declined medications today Return in about 1 year (around 02/11/2023).     JIran Planas PA-C

## 2022-02-10 NOTE — Patient Instructions (Signed)

## 2022-02-11 DIAGNOSIS — E559 Vitamin D deficiency, unspecified: Secondary | ICD-10-CM | POA: Diagnosis not present

## 2022-02-11 DIAGNOSIS — Z131 Encounter for screening for diabetes mellitus: Secondary | ICD-10-CM | POA: Diagnosis not present

## 2022-02-11 DIAGNOSIS — Z0000 Encounter for general adult medical examination without abnormal findings: Secondary | ICD-10-CM | POA: Diagnosis not present

## 2022-02-11 DIAGNOSIS — Z13220 Encounter for screening for lipoid disorders: Secondary | ICD-10-CM | POA: Diagnosis not present

## 2022-02-12 LAB — COMPLETE METABOLIC PANEL WITH GFR
AG Ratio: 1.4 (calc) (ref 1.0–2.5)
ALT: 12 U/L (ref 6–29)
AST: 14 U/L (ref 10–35)
Albumin: 4.1 g/dL (ref 3.6–5.1)
Alkaline phosphatase (APISO): 84 U/L (ref 37–153)
BUN/Creatinine Ratio: 18 (calc) (ref 6–22)
BUN: 22 mg/dL (ref 7–25)
CO2: 26 mmol/L (ref 20–32)
Calcium: 9.7 mg/dL (ref 8.6–10.4)
Chloride: 106 mmol/L (ref 98–110)
Creat: 1.24 mg/dL — ABNORMAL HIGH (ref 0.50–1.03)
Globulin: 2.9 g/dL (calc) (ref 1.9–3.7)
Glucose, Bld: 93 mg/dL (ref 65–99)
Potassium: 4.6 mmol/L (ref 3.5–5.3)
Sodium: 142 mmol/L (ref 135–146)
Total Bilirubin: 0.5 mg/dL (ref 0.2–1.2)
Total Protein: 7 g/dL (ref 6.1–8.1)
eGFR: 52 mL/min/{1.73_m2} — ABNORMAL LOW (ref >=60)

## 2022-02-12 LAB — LIPID PANEL W/REFLEX DIRECT LDL
Cholesterol: 250 mg/dL — ABNORMAL HIGH (ref <200)
HDL: 76 mg/dL (ref >=50)
LDL Cholesterol (Calc): 151 mg/dL (calc) — ABNORMAL HIGH
Non-HDL Cholesterol (Calc): 174 mg/dL (calc) — ABNORMAL HIGH (ref <130)
Total CHOL/HDL Ratio: 3.3 (calc) (ref <5.0)
Triglycerides: 117 mg/dL (ref <150)

## 2022-02-12 LAB — CBC WITH DIFFERENTIAL/PLATELET
Absolute Monocytes: 322 cells/uL (ref 200–950)
Basophils Absolute: 50 cells/uL (ref 0–200)
Basophils Relative: 0.8 %
Eosinophils Absolute: 174 cells/uL (ref 15–500)
Eosinophils Relative: 2.8 %
HCT: 41.1 % (ref 35.0–45.0)
Hemoglobin: 13.2 g/dL (ref 11.7–15.5)
Lymphs Abs: 1655 cells/uL (ref 850–3900)
MCH: 26.2 pg — ABNORMAL LOW (ref 27.0–33.0)
MCHC: 32.1 g/dL (ref 32.0–36.0)
MCV: 81.7 fL (ref 80.0–100.0)
MPV: 10.7 fL (ref 7.5–12.5)
Monocytes Relative: 5.2 %
Neutro Abs: 3999 cells/uL (ref 1500–7800)
Neutrophils Relative %: 64.5 %
Platelets: 263 10*3/uL (ref 140–400)
RBC: 5.03 10*6/uL (ref 3.80–5.10)
RDW: 14.9 % (ref 11.0–15.0)
Total Lymphocyte: 26.7 %
WBC: 6.2 10*3/uL (ref 3.8–10.8)

## 2022-02-12 LAB — VITAMIN D 25 HYDROXY (VIT D DEFICIENCY, FRACTURES): Vit D, 25-Hydroxy: 30 ng/mL (ref 30–100)

## 2022-02-12 LAB — TSH: TSH: 2.13 mIU/L

## 2022-02-13 ENCOUNTER — Encounter: Payer: Self-pay | Admitting: Physician Assistant

## 2022-02-13 DIAGNOSIS — R944 Abnormal results of kidney function studies: Secondary | ICD-10-CM | POA: Insufficient documentation

## 2022-02-13 NOTE — Progress Notes (Signed)
Carnita,   Vitamin D normal but still low normal. I would take at least 2000 units daily.  Thyroid looks great.  Hemoglobin and WBC look good.  Liver enzymes look good.  Kidney function dropped some. You need to back off any anti-inflammatories like mobic. Make sure drinking plenty of water. Recheck in 3 months.  Cholesterol about the same. Overall 10 year risk still pretty low. Continue to work on [employer] and exercise. Recheck in 1 year.   [name].The 10-year ASCVD risk score (Arnett DK, et al., 2019) is: 2.7%   Values used to calculate the score:     Age: 56 years     Sex: Female     Is Non-Hispanic African American: Yes     Diabetic: No     Tobacco smoker: No     Systolic Blood Pressure: 470 mmHg     Is BP treated: No     HDL Cholesterol: 76 mg/dL     Total Cholesterol: 250 mg/dL

## 2022-06-12 ENCOUNTER — Encounter: Payer: Self-pay | Admitting: Physician Assistant

## 2022-06-12 ENCOUNTER — Ambulatory Visit (INDEPENDENT_AMBULATORY_CARE_PROVIDER_SITE_OTHER): Payer: BC Managed Care – PPO | Admitting: Physician Assistant

## 2022-06-12 VITALS — BP 138/76 | HR 104 | Temp 99.3°F | Ht 68.0 in | Wt 231.0 lb

## 2022-06-12 DIAGNOSIS — J40 Bronchitis, not specified as acute or chronic: Secondary | ICD-10-CM

## 2022-06-12 DIAGNOSIS — J329 Chronic sinusitis, unspecified: Secondary | ICD-10-CM | POA: Diagnosis not present

## 2022-06-12 MED ORDER — PREDNISONE 50 MG PO TABS: ORAL_TABLET | ORAL | 0 refills | Status: DC

## 2022-06-12 MED ORDER — AZITHROMYCIN 250 MG PO TABS: ORAL_TABLET | ORAL | 0 refills | Status: DC

## 2022-06-12 NOTE — Progress Notes (Signed)
Acute Office Visit  Subjective:     Patient ID: Jill Webb, female    DOB: 1965/07/31, 57 y.o.   MRN: [id number]  Chief Complaint  Patient presents with   Sore Throat   Cough    HPI Patient is in today for cough, congestion, sinus pressure. She had URI symptoms about 2 weeks ago and then got better. About 9 days ago started feeling bad again. Cough is productive and green colored. Taking OTC mucinex with some relief but not getting better. No fever, chills, body aches or SOB. No lung diseases.   .. Active Ambulatory Problems    Diagnosis Date Noted   History of Helicobacter pylori infection 08/26/2015   Vitamin D insufficiency 08/26/2015   Tonsil asymmetry 08/26/2015   Patchy loss of hair 05/08/2016   Adjustment disorder with mixed anxiety and depressed mood 05/08/2016   Fatigue 05/08/2016   Tonsillar mass 09/30/2016   Hyperlipidemia 10/02/2016   DDD (degenerative disc disease), cervical 10/02/2016   Loss of eyelashes, right 01/22/2017   Anxiety and depression 01/22/2017   Eyes swollen 05/26/2017   Spotting 05/26/2017   Vaginal discharge 05/26/2017   No energy 05/26/2017   Post-menopausal bleeding 05/26/2017   Uterine fibroid 05/28/2017   Endometrial thickening on ultrasound 05/31/2017   Shortness of breath on exertion 07/19/2017   Vitamin D deficiency 07/19/2017   Class 2 obesity due to excess calories without serious comorbidity with body mass index (BMI) of 36.0 to 36.9 in adult 04/08/2018   Depression 04/08/2018   Hot flashes, menopausal 04/08/2018   Brow ptosis, left 05/08/2019   CKD (chronic kidney disease) stage 3, GFR 30-59 ml/min (HCC) 12/25/2019   Elevated LDL cholesterol level 12/25/2019   Dizziness 05/20/2020   Right lumbar radiculitis 06/26/2020   DDD (degenerative disc disease), lumbar 07/29/2020   Acute pain of right knee 11/14/2020   Hot flashes 11/14/2020   Rash and nonspecific skin eruption 02/19/2021   Hyperkeratosis 09/03/2021    Discoloration of skin of toe 09/03/2021   Decreased GFR 02/13/2022   Resolved Ambulatory Problems    Diagnosis Date Noted   No Resolved Ambulatory Problems   Past Medical History:  Diagnosis Date   Anxiety      ROS See HPI.      Objective:    BP 138/76   Pulse (!) 104   Temp 99.3 F (37.4 C) (Oral)   Ht '5\' 8"'$  (1.727 m)   Wt 231 lb (104.8 kg)   LMP  (LMP Unknown) Comment: Last period sometime in 2017  SpO2 97%   BMI 35.12 kg/m  BP Readings from Last 3 Encounters:  06/12/22 138/76  02/10/22 134/75  09/02/21 126/74   Wt Readings from Last 3 Encounters:  06/12/22 231 lb (104.8 kg)  02/10/22 240 lb (108.9 kg)  09/02/21 212 lb (96.2 kg)      Physical Exam Constitutional:      Appearance: She is well-developed. She is obese.  HENT:     Head: Normocephalic.     Right Ear: Ear canal normal. A middle ear effusion is present.     Left Ear: Ear canal normal. A middle ear effusion is present.     Mouth/Throat:     Mouth: Mucous membranes are moist.     Pharynx: Uvula midline. Posterior oropharyngeal erythema present. No oropharyngeal exudate.     Tonsils: No tonsillar exudate or tonsillar abscesses.  Eyes:     Conjunctiva/sclera: Conjunctivae normal.  Cardiovascular:     Rate and  Rhythm: Normal rate and regular rhythm.     Heart sounds: Normal heart sounds.  Pulmonary:     Effort: Pulmonary effort is normal. No respiratory distress.     Breath sounds: Normal breath sounds. No stridor. No wheezing or rhonchi.  Neurological:     General: No focal deficit present.     Mental Status: She is alert and oriented to person, place, and time.  Psychiatric:        Mood and Affect: Mood normal.          Assessment & Plan:  Marland KitchenMarland KitchenLasundra was seen today for sore throat and cough.  Diagnoses and all orders for this visit:  Sinobronchitis -     azithromycin (ZITHROMAX Z-PAK) 250 MG tablet; Take 2 tablets (500 mg) on  Day 1,  followed by 1 tablet (250 mg) once daily on Days  2 through 5. -     predniSONE (DELTASONE) 50 MG tablet; One tab PO daily for 5 days.   Sent zpak and prednisone Start zpak first and see if can avoid prednisone Stay hydrated Continue symptomatic care Follow up as needed and if symptoms persist or worsen   [name], PA-C

## 2022-06-12 NOTE — Patient Instructions (Signed)

## 2022-06-23 ENCOUNTER — Encounter: Payer: Self-pay | Admitting: Physician Assistant

## 2022-06-23 MED ORDER — DOXYCYCLINE HYCLATE 100 MG PO TABS: 100.0000 mg | ORAL_TABLET | Freq: Two times a day (BID) | ORAL | 0 refills | Status: DC

## 2022-08-11 ENCOUNTER — Other Ambulatory Visit: Payer: Self-pay

## 2022-08-11 ENCOUNTER — Encounter: Payer: Self-pay | Admitting: Physician Assistant

## 2022-08-11 MED ORDER — GABAPENTIN 300 MG PO CAPS: 300.0000 mg | ORAL_CAPSULE | Freq: Three times a day (TID) | ORAL | 1 refills | Status: DC

## 2022-11-20 ENCOUNTER — Other Ambulatory Visit: Payer: Self-pay | Admitting: Physician Assistant

## 2022-11-20 DIAGNOSIS — Z1231 Encounter for screening mammogram for malignant neoplasm of breast: Secondary | ICD-10-CM

## 2022-12-30 ENCOUNTER — Ambulatory Visit: Payer: BC Managed Care – PPO

## 2022-12-30 DIAGNOSIS — Z1231 Encounter for screening mammogram for malignant neoplasm of breast: Secondary | ICD-10-CM

## 2023-01-01 ENCOUNTER — Encounter: Payer: Self-pay | Admitting: Physician Assistant

## 2023-01-01 NOTE — Progress Notes (Signed)
Normal mammogram. Follow up in 1 month.

## 2023-02-03 ENCOUNTER — Other Ambulatory Visit: Payer: Self-pay | Admitting: Physician Assistant

## 2023-02-08 DIAGNOSIS — D2122 Benign neoplasm of connective and other soft tissue of left lower limb, including hip: Secondary | ICD-10-CM | POA: Diagnosis not present

## 2023-02-08 DIAGNOSIS — D2121 Benign neoplasm of connective and other soft tissue of right lower limb, including hip: Secondary | ICD-10-CM | POA: Diagnosis not present

## 2023-02-08 DIAGNOSIS — M7752 Other enthesopathy of left foot: Secondary | ICD-10-CM | POA: Diagnosis not present

## 2023-02-08 DIAGNOSIS — M722 Plantar fascial fibromatosis: Secondary | ICD-10-CM | POA: Diagnosis not present

## 2023-02-12 ENCOUNTER — Ambulatory Visit (INDEPENDENT_AMBULATORY_CARE_PROVIDER_SITE_OTHER): Payer: BC Managed Care – PPO | Admitting: Physician Assistant

## 2023-02-12 ENCOUNTER — Encounter: Payer: Self-pay | Admitting: Physician Assistant

## 2023-02-12 ENCOUNTER — Telehealth: Payer: Self-pay

## 2023-02-12 VITALS — BP 139/73 | HR 84 | Ht 68.0 in | Wt 235.0 lb

## 2023-02-12 DIAGNOSIS — Z13220 Encounter for screening for lipoid disorders: Secondary | ICD-10-CM

## 2023-02-12 DIAGNOSIS — Z131 Encounter for screening for diabetes mellitus: Secondary | ICD-10-CM

## 2023-02-12 DIAGNOSIS — N1831 Chronic kidney disease, stage 3a: Secondary | ICD-10-CM

## 2023-02-12 DIAGNOSIS — Z0000 Encounter for general adult medical examination without abnormal findings: Secondary | ICD-10-CM

## 2023-02-12 DIAGNOSIS — R5383 Other fatigue: Secondary | ICD-10-CM

## 2023-02-12 DIAGNOSIS — Z23 Encounter for immunization: Secondary | ICD-10-CM

## 2023-02-12 DIAGNOSIS — F32A Depression, unspecified: Secondary | ICD-10-CM

## 2023-02-12 DIAGNOSIS — M5030 Other cervical disc degeneration, unspecified cervical region: Secondary | ICD-10-CM

## 2023-02-12 DIAGNOSIS — E6609 Other obesity due to excess calories: Secondary | ICD-10-CM

## 2023-02-12 DIAGNOSIS — F419 Anxiety disorder, unspecified: Secondary | ICD-10-CM

## 2023-02-12 DIAGNOSIS — Z6835 Body mass index (BMI) 35.0-35.9, adult: Secondary | ICD-10-CM

## 2023-02-12 MED ORDER — MELOXICAM 15 MG PO TABS
15.0000 mg | ORAL_TABLET | Freq: Every day | ORAL | 1 refills | Status: DC | PRN
Start: 2023-02-12 — End: 2024-02-15

## 2023-02-12 MED ORDER — BUPROPION HCL ER (XL) 150 MG PO TB24: 150.0000 mg | ORAL_TABLET | ORAL | 3 refills | Status: DC

## 2023-02-12 MED ORDER — LORAZEPAM 0.5 MG PO TABS
0.5000 mg | ORAL_TABLET | Freq: Three times a day (TID) | ORAL | 1 refills | Status: DC | PRN
Start: 2023-02-12 — End: 2024-02-15

## 2023-02-12 MED ORDER — ESCITALOPRAM OXALATE 10 MG PO TABS
10.0000 mg | ORAL_TABLET | Freq: Every day | ORAL | 3 refills | Status: DC
Start: 2023-02-12 — End: 2024-02-15

## 2023-02-12 MED ORDER — METHOCARBAMOL 500 MG PO TABS
500.0000 mg | ORAL_TABLET | Freq: Three times a day (TID) | ORAL | 1 refills | Status: DC
Start: 2023-02-12 — End: 2024-02-15

## 2023-02-12 NOTE — Patient Instructions (Signed)

## 2023-02-12 NOTE — Telephone Encounter (Signed)
[facility] [address] [phone] Called- states Meloxicam and lexapro showing Red flag- increase rish of GI bleed,  Wanting to know if o.k. to continue with fill.

## 2023-02-15 ENCOUNTER — Encounter: Payer: Self-pay | Admitting: Physician Assistant

## 2023-02-15 NOTE — Progress Notes (Signed)
Complete physical exam  Patient: Jill Webb   DOB: March 12, 1966   57 y.o. Female  MRN: [id number]  Subjective:    Chief Complaint  Patient presents with   Annual Exam    Jill Webb is a 57 y.o. female who presents today for a complete physical exam. She reports consuming a general diet.  She does not regular exercise but she does walk from time to time.   She generally feels well. She reports sleeping well. She does not have additional problems to discuss today.    Most recent fall risk assessment:    02/15/2023    6:51 AM  Fall Risk   Falls in the past year? 0  Number falls in past yr: 0  Injury with Fall? 0  Risk for fall due to : No Fall Risks  Follow up Falls evaluation completed;Falls prevention discussed     Most recent depression screenings:    02/12/2023    3:40 PM 02/10/2022    3:41 PM  PHQ 2/9 Scores  PHQ - 2 Score 0 2  PHQ- 9 Score  6    Vision:Within last year and Dental: No current dental problems and Receives regular dental care  Patient Active Problem List   Diagnosis Date Noted   Decreased GFR 02/13/2022   Hyperkeratosis 09/03/2021   Discoloration of skin of toe 09/03/2021   Rash and nonspecific skin eruption 02/19/2021   Acute pain of right knee 11/14/2020   Hot flashes 11/14/2020   DDD (degenerative disc disease), lumbar 07/29/2020   Right lumbar radiculitis 06/26/2020   Dizziness 05/20/2020   CKD (chronic kidney disease) stage 3, GFR 30-59 ml/min (HCC) 12/25/2019   Elevated LDL cholesterol level 12/25/2019   Brow ptosis, left 05/08/2019   Class 2 obesity due to excess calories without serious comorbidity with body mass index (BMI) of 35.0 to 35.9 in adult 04/08/2018   Depression 04/08/2018   Hot flashes, menopausal 04/08/2018   Shortness of breath on exertion 07/19/2017   Vitamin D deficiency 07/19/2017   Endometrial thickening on ultrasound 05/31/2017   Uterine fibroid 05/28/2017   Eyes swollen 05/26/2017   Spotting 05/26/2017    Vaginal discharge 05/26/2017   No energy 05/26/2017   Post-menopausal bleeding 05/26/2017   Loss of eyelashes, right 01/22/2017   Anxiety and depression 01/22/2017   Hyperlipidemia 10/02/2016   DDD (degenerative disc disease), cervical 10/02/2016   Tonsillar mass 09/30/2016   Patchy loss of hair 05/08/2016   Adjustment disorder with mixed anxiety and depressed mood 05/08/2016   Fatigue 05/08/2016   History of Helicobacter pylori infection 08/26/2015   Vitamin D insufficiency 08/26/2015   Tonsil asymmetry 08/26/2015   Past Medical History:  Diagnosis Date   Anxiety    Depression    Uterine fibroid    Vitamin D deficiency    Past Surgical History:  Procedure Laterality Date   BUNIONECTOMY  2010   ORTHOPEDIC SURGERY     2015   UTERINE FIBROID SURGERY  2007, 2011   Family History  Problem Relation Age of Onset   Hypothyroidism Mother    High Cholesterol Mother    Cancer Sister        breast   Other Father        unsure of medical history   Allergies  Allergen Reactions   Gluten Meal Other (See Comments)    Bloating and diarrhea   Codeine Rash    Patient Care Team: Nolene Ebbs as PCP - General (  Family Medicine)   Outpatient Medications Prior to Visit  Medication Sig   BLACK COHOSH PO Take by mouth.   Cholecalciferol (VITAMIN D) 50 MCG (2000 UT) CAPS Take by mouth.   gabapentin (NEURONTIN) 300 MG capsule TAKE 1 CAPSULE BY MOUTH THREE TIMES DAILY   [DISCONTINUED] azithromycin (ZITHROMAX Z-PAK) 250 MG tablet Take 2 tablets (500 mg) on  Day 1,  followed by 1 tablet (250 mg) once daily on Days 2 through 5.   [DISCONTINUED] buPROPion (WELLBUTRIN XL) 150 MG 24 hr tablet Take 1 tablet (150 mg total) by mouth every morning.   [DISCONTINUED] doxycycline (VIBRA-TABS) 100 MG tablet Take 1 tablet (100 mg total) by mouth 2 (two) times daily.   [DISCONTINUED] escitalopram (LEXAPRO) 10 MG tablet Take 1 tablet (10 mg total) by mouth daily.   [DISCONTINUED] LORazepam  (ATIVAN) 0.5 MG tablet Take 1 tablet (0.5 mg total) by mouth every 8 (eight) hours as needed. for anxiety   [DISCONTINUED] meloxicam (MOBIC) 15 MG tablet Take 1 tablet (15 mg total) by mouth daily as needed for pain.   [DISCONTINUED] methocarbamol (ROBAXIN) 500 MG tablet Take 1 tablet (500 mg total) by mouth 3 (three) times daily.   [DISCONTINUED] predniSONE (DELTASONE) 50 MG tablet One tab PO daily for 5 days.   No facility-administered medications prior to visit.    Review of Systems  All other systems reviewed and are negative.         Objective:     BP 139/73   Pulse 84   Ht 5\' 8"  (1.727 m)   Wt 235 lb (106.6 kg)   LMP  (LMP Unknown) Comment: Last period sometime in 2017  SpO2 100%   BMI 35.73 kg/m  BP Readings from Last 3 Encounters:  02/12/23 139/73  06/12/22 138/76  02/10/22 134/75   Wt Readings from Last 3 Encounters:  02/12/23 235 lb (106.6 kg)  06/12/22 231 lb (104.8 kg)  02/10/22 240 lb (108.9 kg)      Physical Exam  BP 139/73   Pulse 84   Ht 5\' 8"  (1.727 m)   Wt 235 lb (106.6 kg)   LMP  (LMP Unknown) Comment: Last period sometime in 2017  SpO2 100%   BMI 35.73 kg/m   General Appearance:    Alert, cooperative, obese no distress, appears stated age  Head:    Normocephalic, without obvious abnormality, atraumatic  Eyes:    PERRL, conjunctiva/corneas clear, EOM's intact, fundi    benign, both eyes  Ears:    Normal TM's and external ear canals, both ears  Nose:   Nares normal, septum midline, mucosa normal, no drainage    or sinus tenderness  Throat:   Lips, mucosa, and tongue normal; teeth and gums normal  Neck:   Supple, symmetrical, trachea midline, no adenopathy;    thyroid:  no enlargement/tenderness/nodules; no carotid   bruit or JVD  Back:     Symmetric, no curvature, ROM normal, no CVA tenderness  Lungs:     Clear to auscultation bilaterally, respirations unlabored  Chest Wall:    No tenderness or deformity   Heart:    Regular rate and  rhythm, S1 and S2 normal, no murmur, rub   or gallop     Abdomen:     Soft, non-tender, bowel sounds active all four quadrants,    no masses, no organomegaly        Extremities:   Extremities normal, atraumatic, no cyanosis or edema  Pulses:   2+ and symmetric  all extremities  Skin:   Skin color, texture, turgor normal, no rashes or lesions  Lymph nodes:   Cervical, supraclavicular, and axillary nodes normal  Neurologic:   CNII-XII intact, normal strength, sensation and reflexes    throughout    Assessment & Plan:    Routine Health Maintenance and Physical Exam  Immunization History  Administered Date(s) Administered   COVID-19, mRNA, vaccine(Comirnaty)12 years and older 02/12/2023   Influenza, Seasonal, Injecte, Preservative Fre 02/12/2023   Influenza,inj,Quad PF,6+ Mos 05/08/2016, 01/20/2017, 02/18/2021, 02/10/2022   Influenza-Unspecified 03/16/2015, 03/06/2019, 03/14/2020   PFIZER(Purple Top)SARS-COV-2 Vaccination 08/04/2019, 08/26/2019, 05/09/2020   Tdap 06/03/2010, 11/11/2020   Zoster Recombinant(Shingrix) 06/10/2018, 11/07/2018    Health Maintenance  Topic Date Due   PAP SMEAR-Modifier  11/12/2023   MAMMOGRAM  12/30/2023   Fecal DNA (Cologuard)  09/19/2024   DTaP/Tdap/Td (3 - Td or Tdap) 11/12/2030   INFLUENZA VACCINE  Completed   COVID-19 Vaccine  Completed   Hepatitis C Screening  Completed   HIV Screening  Completed   Zoster Vaccines- Shingrix  Completed   HPV VACCINES  Aged Out   Colonoscopy  Discontinued    Discussed health benefits of physical activity, and encouraged her to engage in regular exercise appropriate for her age and condition. Marland KitchenHerbert Seta was seen today for annual exam.  Diagnoses and all orders for this visit:  Routine physical examination -     CBC w/Diff/Platelet -     TSH -     Lipid panel -     CMP14+EGFR -     Vitamin D (25 hydroxy)  Need for influenza vaccination -     Flu vaccine trivalent PF, 6mos and  older(Flulaval,Afluria,Fluarix,Fluzone)  Need for COVID-19 vaccine -     Pfizer Comirnaty Covid -19 Vaccine 44yrs and older  Screening for diabetes mellitus -     CMP14+EGFR  Screening for lipid disorders -     Lipid panel  DDD (degenerative disc disease), cervical -     methocarbamol (ROBAXIN) 500 MG tablet; Take 1 tablet (500 mg total) by mouth 3 (three) times daily. -     meloxicam (MOBIC) 15 MG tablet; Take 1 tablet (15 mg total) by mouth daily as needed for pain.  Anxiety and depression -     buPROPion (WELLBUTRIN XL) 150 MG 24 hr tablet; Take 1 tablet (150 mg total) by mouth every morning. -     escitalopram (LEXAPRO) 10 MG tablet; Take 1 tablet (10 mg total) by mouth daily. -     LORazepam (ATIVAN) 0.5 MG tablet; Take 1 tablet (0.5 mg total) by mouth every 8 (eight) hours as needed. for anxiety  No energy -     buPROPion (WELLBUTRIN XL) 150 MG 24 hr tablet; Take 1 tablet (150 mg total) by mouth every morning.  Stage 3a chronic kidney disease (HCC)  Class 2 obesity due to excess calories without serious comorbidity with body mass index (BMI) of 35.0 to 35.9 in adult   .[name] Discussed 150 minutes of exercise a week.  Encouraged vitamin D 1000 units and Calcium 1300mg  or 4 servings of dairy a day.  Continue to work on healthy weight and regular exercise to get to BMI under 30 No PHQ concerns Fasting labs ordered today Refilled wellbutrin and lexapro Mammogram and pap UTD Cologuard UTD Flu and covid given today   Return in about 1 year (around 02/12/2024).     Tandy Gaw, PA-C

## 2023-02-15 NOTE — Telephone Encounter (Signed)
Patient informed and pharmacy informed

## 2023-02-19 DIAGNOSIS — Z0000 Encounter for general adult medical examination without abnormal findings: Secondary | ICD-10-CM | POA: Diagnosis not present

## 2023-02-19 DIAGNOSIS — Z13220 Encounter for screening for lipoid disorders: Secondary | ICD-10-CM | POA: Diagnosis not present

## 2023-02-19 DIAGNOSIS — Z131 Encounter for screening for diabetes mellitus: Secondary | ICD-10-CM | POA: Diagnosis not present

## 2023-02-20 LAB — CBC WITH DIFFERENTIAL/PLATELET
Basophils Absolute: 0.1 10*3/uL (ref 0.0–0.2)
Basos: 1 %
EOS (ABSOLUTE): 0.2 10*3/uL (ref 0.0–0.4)
Eos: 3 %
Hematocrit: 43.2 % (ref 34.0–46.6)
Hemoglobin: 13.4 g/dL (ref 11.1–15.9)
Immature Grans (Abs): 0 10*3/uL (ref 0.0–0.1)
Immature Granulocytes: 0 %
Lymphocytes Absolute: 2.7 10*3/uL (ref 0.7–3.1)
Lymphs: 40 %
MCH: 25.1 pg — ABNORMAL LOW (ref 26.6–33.0)
MCHC: 31 g/dL — ABNORMAL LOW (ref 31.5–35.7)
MCV: 81 fL (ref 79–97)
Monocytes Absolute: 0.4 10*3/uL (ref 0.1–0.9)
Monocytes: 6 %
Neutrophils Absolute: 3.5 10*3/uL (ref 1.4–7.0)
Neutrophils: 50 %
Platelets: 338 10*3/uL (ref 150–450)
RBC: 5.34 x10E6/uL — ABNORMAL HIGH (ref 3.77–5.28)
RDW: 16.1 % — ABNORMAL HIGH (ref 11.7–15.4)
WBC: 6.9 10*3/uL (ref 3.4–10.8)

## 2023-02-20 LAB — CMP14+EGFR
ALT: 14 IU/L (ref 0–32)
AST: 20 IU/L (ref 0–40)
Albumin: 4.4 g/dL (ref 3.8–4.9)
Alkaline Phosphatase: 97 IU/L (ref 44–121)
BUN/Creatinine Ratio: 16 (ref 9–23)
BUN: 16 mg/dL (ref 6–24)
Bilirubin Total: 0.6 mg/dL (ref 0.0–1.2)
CO2: 21 mmol/L (ref 20–29)
Calcium: 9.5 mg/dL (ref 8.7–10.2)
Chloride: 102 mmol/L (ref 96–106)
Creatinine, Ser: 1.03 mg/dL — ABNORMAL HIGH (ref 0.57–1.00)
Globulin, Total: 2.8 g/dL (ref 1.5–4.5)
Glucose: 102 mg/dL — ABNORMAL HIGH (ref 70–99)
Potassium: 4.6 mmol/L (ref 3.5–5.2)
Sodium: 139 mmol/L (ref 134–144)
Total Protein: 7.2 g/dL (ref 6.0–8.5)
eGFR: 65 mL/min/{1.73_m2} (ref >59)

## 2023-02-20 LAB — VITAMIN D 25 HYDROXY (VIT D DEFICIENCY, FRACTURES): Vit D, 25-Hydroxy: 39 ng/mL (ref 30.0–100.0)

## 2023-02-20 LAB — LIPID PANEL
Chol/HDL Ratio: 3.5 ratio (ref 0.0–4.4)
Cholesterol, Total: 232 mg/dL — ABNORMAL HIGH (ref 100–199)
HDL: 67 mg/dL (ref >39)
LDL Chol Calc (NIH): 145 mg/dL — ABNORMAL HIGH (ref 0–99)
Triglycerides: 112 mg/dL (ref 0–149)
VLDL Cholesterol Cal: 20 mg/dL (ref 5–40)

## 2023-02-20 LAB — TSH: TSH: 0.342 u[IU]/mL — ABNORMAL LOW (ref 0.450–4.500)

## 2023-02-22 ENCOUNTER — Encounter: Payer: Self-pay | Admitting: Physician Assistant

## 2023-02-22 DIAGNOSIS — R7989 Other specified abnormal findings of blood chemistry: Secondary | ICD-10-CM | POA: Insufficient documentation

## 2023-02-22 NOTE — Progress Notes (Signed)
[facility],   Vitamin D is better but would increase by 1000 units to whatever you are taking and make sure taking with dairy to help with absorption.   Kidney function much better.   Glucose is a little elevated. Please add A1C.   Your TSH is low. Showing signs of HYPER too much thyroid. Please add free T4/T3 and anti TPO to better evaluate.   LDL, bad cholesterol, is not to goal. 10 year cardiovascular risk is 3.6 percent and still under 7. Exercise and low fat/fried/processed food diet can help decrease number. We can consider statin since your cholesterol risk continues to elevate year to year as well.   [name].The 10-year ASCVD risk score (Arnett DK, et al., 2019) is: 3.6%   Values used to calculate the score:     Age: 57 years     Sex: Female     Is Non-Hispanic African American: Yes     Diabetic: No     Tobacco smoker: No     Systolic Blood Pressure: 139 mmHg     Is BP treated: No     HDL Cholesterol: 67 mg/dL     Total Cholesterol: 232 mg/dL

## 2023-02-24 ENCOUNTER — Encounter: Payer: Self-pay | Admitting: Physician Assistant

## 2023-02-24 ENCOUNTER — Other Ambulatory Visit: Payer: Self-pay

## 2023-02-24 DIAGNOSIS — U071 COVID-19: Secondary | ICD-10-CM

## 2023-02-24 MED ORDER — NIRMATRELVIR/RITONAVIR (PAXLOVID)TABLET: 3.0000 | ORAL_TABLET | Freq: Two times a day (BID) | ORAL | 0 refills | Status: AC

## 2023-02-24 MED ORDER — NIRMATRELVIR/RITONAVIR (PAXLOVID)TABLET
3.0000 | ORAL_TABLET | Freq: Two times a day (BID) | ORAL | 0 refills | Status: DC
Start: 2023-02-24 — End: 2023-02-24

## 2023-03-16 ENCOUNTER — Encounter: Payer: Self-pay | Admitting: Physician Assistant

## 2023-03-16 DIAGNOSIS — R7989 Other specified abnormal findings of blood chemistry: Secondary | ICD-10-CM

## 2023-03-16 DIAGNOSIS — R7309 Other abnormal glucose: Secondary | ICD-10-CM

## 2023-03-17 ENCOUNTER — Other Ambulatory Visit: Payer: Self-pay

## 2023-03-17 DIAGNOSIS — R7989 Other specified abnormal findings of blood chemistry: Secondary | ICD-10-CM

## 2023-03-17 NOTE — Telephone Encounter (Signed)
Will you please order TSH and T4 and T3 and anti TPO.

## 2023-03-17 NOTE — Telephone Encounter (Signed)
Added TSH, free T3, free T4 and anti-TPO

## 2023-03-23 DIAGNOSIS — R7309 Other abnormal glucose: Secondary | ICD-10-CM | POA: Diagnosis not present

## 2023-03-23 DIAGNOSIS — R7989 Other specified abnormal findings of blood chemistry: Secondary | ICD-10-CM | POA: Diagnosis not present

## 2023-03-24 NOTE — Progress Notes (Signed)
Jill Webb,   A1C normal range. Sugars look good.  Thyroid looks good.  Pending antibodies to thyroid.

## 2023-03-27 LAB — ANTI-TPO AB (RDL): Anti-TPO Ab (RDL): <9 [IU]/mL (ref <9.0)

## 2023-03-27 LAB — TSH+FREE T4
Free T4: 0.99 ng/dL (ref 0.82–1.77)
TSH: 1.39 u[IU]/mL (ref 0.450–4.500)

## 2023-03-27 LAB — HEMOGLOBIN A1C
Est. average glucose Bld gHb Est-mCnc: 100 mg/dL
Hgb A1c MFr Bld: 5.1 % (ref 4.8–5.6)

## 2023-03-27 LAB — T3, FREE: T3, Free: 2.9 pg/mL (ref 2.0–4.4)

## 2023-03-29 NOTE — Progress Notes (Signed)
Jill Webb,   No abnormal antibodies to thyroid found! Great news.

## 2023-06-01 ENCOUNTER — Telehealth: Payer: Self-pay

## 2023-06-01 ENCOUNTER — Encounter: Payer: Self-pay | Admitting: Physician Assistant

## 2023-06-01 MED ORDER — NIRMATRELVIR/RITONAVIR (PAXLOVID)TABLET: 3.0000 | ORAL_TABLET | Freq: Two times a day (BID) | ORAL | 0 refills | Status: AC

## 2023-06-01 NOTE — Telephone Encounter (Signed)
 Copied from CRM [id number]. Topic: Clinical - Prescription Issue >> Jun 01, 2023  2:16 PM Joesph PARAS wrote: Reason for CRM: nirmatrelvir /ritonavir  (PAXLOVID ) 20 x 150 MG & 10 x 100MG  TABS [facility] in [place] calling to inquire if Paxlovid  written is for renally compromised patients. Directions and dosage are for normal but thinks it may have been intended otherwise. Lee with [employer] can be reached at [phone] for clarity.

## 2023-06-01 NOTE — Telephone Encounter (Signed)
 Copied from CRM [id number]. Topic: Clinical - Prescription Issue >> Jun 01, 2023  3:45 PM Alfonso ORN wrote: Reason for CRM: Patient call that there is an issue with the nirmatrelvir /ritonavir  (PAXLOVID ) 20 x 150 MG & 10 x 100MG  TABS , pharmacy do not have the lower dose, however do have the Higher dose  and want to know if can change to a higher strength  Patient sitting in parking lot at the pharmacy waiting , office will call Patient back can not guarantee if can be resolve today .

## 2023-06-01 NOTE — Telephone Encounter (Signed)
Pharmacy is aware

## 2023-12-13 ENCOUNTER — Other Ambulatory Visit: Payer: Self-pay | Admitting: Physician Assistant

## 2023-12-13 DIAGNOSIS — Z1231 Encounter for screening mammogram for malignant neoplasm of breast: Secondary | ICD-10-CM

## 2024-01-06 ENCOUNTER — Ambulatory Visit

## 2024-01-20 ENCOUNTER — Ambulatory Visit

## 2024-01-20 DIAGNOSIS — Z1231 Encounter for screening mammogram for malignant neoplasm of breast: Secondary | ICD-10-CM

## 2024-01-24 ENCOUNTER — Ambulatory Visit: Payer: Self-pay | Admitting: Physician Assistant

## 2024-01-24 NOTE — Progress Notes (Signed)
 Normal mammogram. Follow up in 1 year.

## 2024-02-15 ENCOUNTER — Ambulatory Visit (INDEPENDENT_AMBULATORY_CARE_PROVIDER_SITE_OTHER): Admitting: Physician Assistant

## 2024-02-15 ENCOUNTER — Encounter: Payer: Self-pay | Admitting: Physician Assistant

## 2024-02-15 VITALS — BP 107/70 | HR 83 | Temp 98.1°F | Resp 20 | Ht 68.0 in | Wt 228.0 lb

## 2024-02-15 DIAGNOSIS — E559 Vitamin D deficiency, unspecified: Secondary | ICD-10-CM

## 2024-02-15 DIAGNOSIS — N1831 Chronic kidney disease, stage 3a: Secondary | ICD-10-CM

## 2024-02-15 DIAGNOSIS — E7800 Pure hypercholesterolemia, unspecified: Secondary | ICD-10-CM

## 2024-02-15 DIAGNOSIS — Z6834 Body mass index (BMI) 34.0-34.9, adult: Secondary | ICD-10-CM

## 2024-02-15 DIAGNOSIS — E782 Mixed hyperlipidemia: Secondary | ICD-10-CM

## 2024-02-15 DIAGNOSIS — E66811 Other obesity due to excess calories: Secondary | ICD-10-CM

## 2024-02-15 DIAGNOSIS — E6609 Other obesity due to excess calories: Secondary | ICD-10-CM

## 2024-02-15 DIAGNOSIS — Z0000 Encounter for general adult medical examination without abnormal findings: Secondary | ICD-10-CM

## 2024-02-15 DIAGNOSIS — Z23 Encounter for immunization: Secondary | ICD-10-CM | POA: Diagnosis not present

## 2024-02-15 DIAGNOSIS — M5030 Other cervical disc degeneration, unspecified cervical region: Secondary | ICD-10-CM

## 2024-02-15 NOTE — Patient Instructions (Signed)
 Phentermine ; Topiramate  Extended-Release Capsules What is this medication? Phentermine ; Topiramate  (FEN ter meen; Toe PYRE a mate) promotes weight loss. It may also be used to maintain weight loss. It works by decreasing appetite. Changes to diet and exercise are often combined with this medication. This medicine may be used for other purposes; ask your health care provider or pharmacist if you have questions. COMMON BRAND NAME(S): Qsymia  What should I tell my care team before I take this medication? They need to know if you have any of these conditions: Depression or other mental health conditions Diabetes Diarrhea Glaucoma Having surgery High thyroid  levels History of substance use disorder Kidney disease Kidney stones Liver disease Low levels of potassium in the blood Lung disease Metabolic acidosis On a ketogenic diet Osteoporosis, weak bones Seizures Suicidal thoughts, plans, or attempt by you or a family member Taken an MAOI, such as Marplan, Nardil, or Parnate in the last 14 days Trouble sleeping An unusual or allergic reaction to phentermine , topiramate , other medications, foods, dyes, or preservatives Pregnant or trying to get pregnant Breastfeeding How should I use this medication? Take this medication by mouth with a glass of water. Follow the directions on the prescription label. Do not crush or chew. This medication is usually taken with or without food once per day in the morning. Avoid taking this medication in the evening. It may interfere with sleep. Take your doses at regular intervals. Do not take your medication more often than directed. A special MedGuide will be given to you by the pharmacist with each prescription and refill. Be sure to read this information carefully each time. Talk to your care team about the use of this medication in children. While it may be prescribed for children as young as 12 years for selected conditions, precautions do  apply. Overdosage: If you think you have taken too much of this medicine contact a poison control center or emergency room at once. NOTE: This medicine is only for you. Do not share this medicine with others. What if I miss a dose? If you miss a dose, skip it. Take your next dose at the normal time. Do not take extra or 2 doses at the same time to make up for the missed dose. What may interact with this medication? Do not take this medication with any of the following: MAOIs like Carbex, Eldepryl, Marplan, Nardil, and Parnate This medication may also interact with the following: Acetazolamide Alcohol Antihistamines for allergy, cough and cold Atropine Birth control pills Carbamazepine Certain medications for bladder problems like oxybutynin, tolterodine Certain medications for depression, anxiety, or psychotic disturbances Certain medications for high blood pressure Certain medications for Parkinson disease like benztropine, trihexyphenidyl Certain medications for sleep Certain medications for stomach problems like dicyclomine, hyoscyamine Certain medications for travel sickness like scopolamine Dichlorphenamide Digoxin Diuretics Linezolid Medications for colds or breathing difficulties like pseudoephedrine or phenylephrine Medications for diabetes Methazolamide Narcotic medications for pain Phenytoin Sibutramine Stimulant medications for attention disorders, weight loss, or to stay awake Valproic acid Zonisamide This list may not describe all possible interactions. Give your health care provider a list of all the medicines, herbs, non-prescription drugs, or dietary supplements you use. Also tell them if you smoke, drink alcohol, or use illegal drugs. Some items may interact with your medicine. What should I watch for while using this medication? Visit your care team for regular checks on your progress. Do not stop taking this medication except on your care team's advice. You  may develop a severe  reaction. Your care team will tell you how much medication to take. Do not take this medication close to bedtime. It may prevent you from sleeping. Avoid extreme heat. This medication can cause you to sweat less than normal. Your body temperature could increase to dangerous levels, which may lead to heat stroke. You should drink plenty of fluids while taking this medication. If you have had kidney stones in the past, this will help to reduce your chances of forming kidney stones. You may get drowsy or dizzy. Do not drive, use machinery, or do anything that needs mental alertness until you know how this medication affects you. Do not stand or sit up quickly, especially if you are an older patient. This reduces the risk of dizzy or fainting spells. Alcohol may increase dizziness and drowsiness. Avoid alcoholic drinks. This medication may affect blood sugar levels. Ask your care team if changes in diet or medications are needed if you have diabetes. Check with your care team if you have severe diarrhea, nausea, and vomiting, or if you sweat a lot. The loss of too much body fluid may make it dangerous for you to take this medication. Tell your care team right away if you have any change in your eyesight. Watch for new or worsening thoughts of suicide or depression. This includes sudden changes in mood, behaviors, or thoughts. These changes can happen at any time but are more common in the beginning of treatment or after a change in dose. Call your care team right away if you experience these thoughts or worsening depression. This medication may cause serious skin reactions. They can happen weeks to months after starting the medication. Contact your care team right away if you notice fevers or flu-like symptoms with a rash. The rash may be red or purple and then turn into blisters or peeling of the skin. Or, you might notice a red rash with swelling of the face, lips or lymph nodes in your  neck or under your arms. Birth control may not work properly while you are taking this medication. Talk to your care team about using an extra method of birth control. Tell your care team if you wish to become pregnant or think you might be pregnant. There is a potential for serious side effects and harm to an unborn child. Losing weight while pregnant is not advised and may cause harm to the unborn child. Talk to your care team for more information. What side effects may I notice from receiving this medication? Side effects that you should report to your care team as soon as possible: Allergic reactions--skin rash, itching, hives, swelling of the face, lips, tongue, or throat Difficulty with paying attention, memory, or speech Fast or irregular heartbeat Fever that does not go away, decreased sweating High acid level--trouble breathing, unusual weakness or fatigue, confusion, headache, fast or irregular heartbeat, nausea, vomiting High ammonia level--unusual weakness or fatigue, confusion, loss of appetite, nausea, vomiting, seizures Kidney injury--decrease in the amount of urine, swelling of the ankles, hands, or feet Kidney stones--blood in the urine, pain or trouble passing urine, pain in the lower back or sides Low potassium level--muscle pain or cramps, unusual weakness or fatigue, fast or irregular heartbeat, constipation Mood and behavior changes--anxiety, nervousness, confusion, hallucinations, irritability, hostility, thoughts of suicide or self-harm, worsening mood, feelings of depression Redness, blistering, peeling, or loosening of the skin, including inside the mouth Sudden eye pain or change in vision such as blurry vision, seeing halos around lights, vision loss  Side effects that usually do not require medical attention (report to your care team if they continue or are bothersome): Burning or tingling sensation in hands or feet Change in taste Constipation Dizziness Dry  mouth Trouble sleeping This list may not describe all possible side effects. Call your doctor for medical advice about side effects. You may report side effects to FDA at 1-800-FDA-1088. Where should I keep my medication? Keep out of the reach of children and pets. This medication can be abused. Keep it in a safe place to protect it from theft. Do not share it with anyone. It is only for you. Selling or giving away this medication is dangerous and against the law. Store at room temperature between 15 and 25 degrees C (59 and 77 degrees F). Protect from moisture. Keep the container tightly closed. This medication may cause harm and death if it is taken by other adults, children, or pets. It is important to get rid of the medication as soon as you no longer need it, or if it is expired. You can do this in two ways: Take the medication to a medication take-back program. Check with your pharmacy or law enforcement to find a location. If you cannot return the medication, check the label or package insert to see if the medication should be thrown out in the garbage or flushed down the toilet. If you are not sure, ask your care team. If it is safe to put in the trash, take the medication out of the container. Mix the medication with cat litter, dirt, coffee grounds, or other unwanted substance. Seal the mixture in a bag or container. Put it in the trash. NOTE: This sheet is a summary. It may not cover all possible information. If you have questions about this medicine, talk to your doctor, pharmacist, or health care provider.  2024 Elsevier/Gold Standard (2023-02-17 00:00:00)

## 2024-02-15 NOTE — Progress Notes (Unsigned)
 Complete physical exam  Patient: Jill Webb   DOB: 1966-01-10   58 y.o. Female  MRN: [id number]  Subjective:    Chief Complaint  Patient presents with   Annual Exam    Jill Webb is a 58 y.o. female who presents today for a complete physical exam. She reports consuming a general diet. She is currently exercising through an Osteoporosis Prevention Study at [facility]. She generally feels well. She reports sleeping well. She does have additional problems to discuss today. She would like to start medication for weight loss. She has used GLP-1s in the past, but they are no longer covered by her insurance.    Most recent fall risk assessment:    02/16/2024    6:56 AM  Fall Risk   Falls in the past year? 0  Number falls in past yr: 0  Injury with Fall? 0  Risk for fall due to : No Fall Risks  Follow up Falls evaluation completed     Most recent depression screenings:    02/15/2024    8:50 AM 02/12/2023    3:40 PM  PHQ 2/9 Scores  PHQ - 2 Score 0 0  PHQ- 9 Score 2     Vision:Within last year and Dental: No current dental problems and Receives regular dental care  Patient Active Problem List   Diagnosis Date Noted   Decreased thyroid  stimulating hormone (TSH) level 02/22/2023   Decreased GFR 02/13/2022   Hyperkeratosis 09/03/2021   Discoloration of skin of toe 09/03/2021   Rash and nonspecific skin eruption 02/19/2021   Acute pain of right knee 11/14/2020   Hot flashes 11/14/2020   DDD (degenerative disc disease), lumbar 07/29/2020   Right lumbar radiculitis 06/26/2020   Dizziness 05/20/2020   CKD (chronic kidney disease) stage 3, GFR 30-59 ml/min (HCC) 12/25/2019   Elevated LDL cholesterol level 12/25/2019   Brow ptosis, left 05/08/2019   Class 1 obesity due to excess calories without serious comorbidity with body mass index (BMI) of 34.0 to 34.9 in adult 04/08/2018   Depression 04/08/2018   Hot flashes, menopausal 04/08/2018   Shortness of breath on  exertion 07/19/2017   Vitamin D  deficiency 07/19/2017   Endometrial thickening on ultrasound 05/31/2017   Uterine fibroid 05/28/2017   Eyes swollen 05/26/2017   Spotting 05/26/2017   Vaginal discharge 05/26/2017   No energy 05/26/2017   Post-menopausal bleeding 05/26/2017   Loss of eyelashes, right 01/22/2017   Anxiety and depression 01/22/2017   Hyperlipidemia 10/02/2016   DDD (degenerative disc disease), cervical 10/02/2016   Tonsillar mass 09/30/2016   Patchy loss of hair 05/08/2016   Adjustment disorder with mixed anxiety and depressed mood 05/08/2016   Fatigue 05/08/2016   History of Helicobacter pylori infection 08/26/2015   Vitamin D  insufficiency 08/26/2015   Tonsil asymmetry 08/26/2015   Past Medical History:  Diagnosis Date   Anxiety    Depression    Uterine fibroid    Vitamin D  deficiency    Past Surgical History:  Procedure Laterality Date   BUNIONECTOMY  2010   ORTHOPEDIC SURGERY     2015   UTERINE FIBROID SURGERY  2007, 2011   Family History  Problem Relation Age of Onset   Hypothyroidism Mother    High Cholesterol Mother    Cancer Sister        breast   Other Father        unsure of medical history   Allergies  Allergen Reactions   Gluten Meal  Other (See Comments)    Bloating and diarrhea   Codeine Rash      Patient Care Team: Jeanett Antonopoulos L, PA-C as PCP - General (Family Medicine)   Outpatient Medications Prior to Visit  Medication Sig   [DISCONTINUED] gabapentin  (NEURONTIN ) 300 MG capsule TAKE 1 CAPSULE BY MOUTH THREE TIMES DAILY   [DISCONTINUED] BLACK COHOSH PO Take by mouth. (Patient not taking: Reported on 02/15/2024)   [DISCONTINUED] buPROPion  (WELLBUTRIN  XL) 150 MG 24 hr tablet Take 1 tablet (150 mg total) by mouth every morning. (Patient not taking: Reported on 02/15/2024)   [DISCONTINUED] Cholecalciferol (VITAMIN D ) 50 MCG (2000 UT) CAPS Take by mouth. (Patient not taking: Reported on 02/15/2024)   [DISCONTINUED] escitalopram   (LEXAPRO ) 10 MG tablet Take 1 tablet (10 mg total) by mouth daily. (Patient not taking: Reported on 02/15/2024)   [DISCONTINUED] LORazepam  (ATIVAN ) 0.5 MG tablet Take 1 tablet (0.5 mg total) by mouth every 8 (eight) hours as needed. for anxiety (Patient not taking: Reported on 02/15/2024)   [DISCONTINUED] meloxicam  (MOBIC ) 15 MG tablet Take 1 tablet (15 mg total) by mouth daily as needed for pain. (Patient not taking: Reported on 02/15/2024)   [DISCONTINUED] methocarbamol  (ROBAXIN ) 500 MG tablet Take 1 tablet (500 mg total) by mouth 3 (three) times daily. (Patient not taking: Reported on 02/15/2024)   No facility-administered medications prior to visit.    Review of Systems  All other systems reviewed and are negative.         Objective:     BP 107/70 (BP Location: Right Arm, Patient Position: Sitting, Cuff Size: Normal)   Pulse 83   Temp 98.1 F (36.7 C) (Oral)   Resp 20   Ht 5' 8 (1.727 m)   Wt 228 lb (103.4 kg)   LMP  (LMP Unknown) Comment: Last period sometime in 2017  SpO2 98%   BMI 34.67 kg/m  BP Readings from Last 3 Encounters:  02/15/24 107/70  02/12/23 139/73  06/12/22 138/76   Wt Readings from Last 3 Encounters:  02/15/24 228 lb (103.4 kg)  02/12/23 235 lb (106.6 kg)  06/12/22 231 lb (104.8 kg)      Physical Exam     BP 107/70 (BP Location: Right Arm, Patient Position: Sitting, Cuff Size: Normal)   Pulse 83   Temp 98.1 F (36.7 C) (Oral)   Resp 20   Ht 5' 8 (1.727 m)   Wt 228 lb (103.4 kg)   LMP  (LMP Unknown) Comment: Last period sometime in 2017  SpO2 98%   BMI 34.67 kg/m   General Appearance:    Alert, cooperative, obese no distress, appears stated age  Head:    Normocephalic, without obvious abnormality, atraumatic  Eyes:    PERRL, conjunctiva/corneas clear, EOM's intact, fundi    benign, both eyes(eyelashes absent from right eye)  Ears:    Normal TM's and external ear canals, both ears  Nose:   Nares normal, septum midline, mucosa normal, no  drainage    or sinus tenderness  Throat:   Lips, mucosa, and tongue normal; teeth and gums normal  Neck:   Supple, symmetrical, trachea midline, no adenopathy;    thyroid :  no enlargement/tenderness/nodules; no carotid   bruit or JVD  Back:     Symmetric, no curvature, ROM normal, no CVA tenderness  Lungs:     Clear to auscultation bilaterally, respirations unlabored  Chest Wall:    No tenderness or deformity   Heart:    Regular rate and rhythm, S1  and S2 normal, no murmur, rub   or gallop     Abdomen:     Soft, non-tender, bowel sounds active all four quadrants,    no masses, no organomegaly        Extremities:   Extremities normal, atraumatic, no cyanosis or edema  Pulses:   2+ and symmetric all extremities  Skin:   Skin color, texture, turgor normal, no rashes or lesions  Lymph nodes:   Cervical, supraclavicular, and axillary nodes normal  Neurologic:   CNII-XII intact, normal strength, sensation and reflexes    throughout   Assessment & Plan:    Routine Health Maintenance and Physical Exam  Immunization History  Administered Date(s) Administered   Influenza, Seasonal, Injecte, Preservative Fre 02/12/2023, 02/15/2024   Influenza,inj,Quad PF,6+ Mos 05/08/2016, 01/20/2017, 02/18/2021, 02/10/2022   Influenza-Unspecified 03/16/2015, 03/06/2019, 03/14/2020   PFIZER(Purple Top)SARS-COV-2 Vaccination 08/04/2019, 08/26/2019, 05/09/2020   PNEUMOCOCCAL CONJUGATE-20 02/15/2024   Pfizer(Comirnaty)Fall Seasonal Vaccine 12 years and older 02/12/2023   Tdap 06/03/2010, 11/11/2020   Zoster Recombinant(Shingrix ) 06/10/2018, 11/07/2018    Health Maintenance  Topic Date Due   Hepatitis B Vaccines 19-59 Average Risk (1 of 3 - 19+ 3-dose series) Never done   Fecal DNA (Cologuard)  09/19/2024   Mammogram  01/19/2025   Cervical Cancer Screening (HPV/Pap Cotest)  11/11/2025   DTaP/Tdap/Td (3 - Td or Tdap) 11/12/2030   Pneumococcal Vaccine: 50+ Years  Completed   Influenza Vaccine  Completed    COVID-19 Vaccine  Completed   Hepatitis C Screening  Completed   HIV Screening  Completed   Zoster Vaccines- Shingrix   Completed   HPV VACCINES  Aged Out   Meningococcal B Vaccine  Aged Out   Colonoscopy  Discontinued    Discussed health benefits of physical activity, and encouraged her to engage in regular exercise appropriate for her age and condition.  SABRALady was seen today for annual exam.  Diagnoses and all orders for this visit:  Routine physical examination -     Lipid panel -     CMP14+EGFR -     TSH + free T4 -     CBC w/Diff/Platelet -     VITAMIN D  25 Hydroxy (Vit-D Deficiency, Fractures) -     T3, free  Vitamin D  insufficiency -     VITAMIN D  25 Hydroxy (Vit-D Deficiency, Fractures)  Mixed hyperlipidemia -     Lipid panel  Vitamin D  deficiency -     VITAMIN D  25 Hydroxy (Vit-D Deficiency, Fractures)  Stage 3a chronic kidney disease (HCC) -     CMP14+EGFR  Elevated LDL cholesterol level -     Lipid panel  Class 1 obesity due to excess calories without serious comorbidity with body mass index (BMI) of 34.0 to 34.9 in adult  Immunization due -     Pneumococcal conjugate vaccine 20-valent (Prevnar 20) -     Flu vaccine trivalent PF, 6mos and older(Flulaval,Afluria,Fluarix,Fluzone)  DDD (degenerative disc disease), cervical -     gabapentin  (NEURONTIN ) 300 MG capsule; Take 1 capsule (300 mg total) by mouth 3 (three) times daily.   .. Discussed 150 minutes of exercise a week.  Encouraged vitamin D  1000 units and Calcium 1300mg  or 4 servings of dairy a day.  Fasting labs ordered PHQ no concerns No falls Mammogram UTD Pap UTD Cologuard UTD Pneumonia and flu vaccine given today  .SABRADiscussed low carb diet with 1500 calories and 80g of protein.  Exercising at least 150 minutes a week.  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  Discussed weight loss medication options to include contrave and qsymia  Or compounded GLP-1 Pt will consider and let me  know  Cervical DDD pain controlled on gabapentin  and medication refilled today Return in about 1 year (around 02/14/2025).     Julieth Tugman, PA-C

## 2024-02-16 ENCOUNTER — Ambulatory Visit: Payer: Self-pay | Admitting: Physician Assistant

## 2024-02-16 LAB — CMP14+EGFR
ALT: 13 IU/L (ref 0–32)
AST: 18 IU/L (ref 0–40)
Albumin: 4.4 g/dL (ref 3.8–4.9)
Alkaline Phosphatase: 89 IU/L (ref 49–135)
BUN/Creatinine Ratio: 17 (ref 9–23)
BUN: 17 mg/dL (ref 6–24)
Bilirubin Total: 0.6 mg/dL (ref 0.0–1.2)
CO2: 23 mmol/L (ref 20–29)
Calcium: 9.7 mg/dL (ref 8.7–10.2)
Chloride: 106 mmol/L (ref 96–106)
Creatinine, Ser: 1.02 mg/dL — ABNORMAL HIGH (ref 0.57–1.00)
Globulin, Total: 2.7 g/dL (ref 1.5–4.5)
Glucose: 96 mg/dL (ref 70–99)
Potassium: 4.2 mmol/L (ref 3.5–5.2)
Sodium: 145 mmol/L — ABNORMAL HIGH (ref 134–144)
Total Protein: 7.1 g/dL (ref 6.0–8.5)
eGFR: 65 mL/min/1.73 (ref >59)

## 2024-02-16 LAB — CBC WITH DIFFERENTIAL/PLATELET
Basophils Absolute: 0.1 x10E3/uL (ref 0.0–0.2)
Basos: 1 %
EOS (ABSOLUTE): 0.1 x10E3/uL (ref 0.0–0.4)
Eos: 2 %
Hematocrit: 43.2 % (ref 34.0–46.6)
Hemoglobin: 13.8 g/dL (ref 11.1–15.9)
Immature Grans (Abs): 0 x10E3/uL (ref 0.0–0.1)
Immature Granulocytes: 0 %
Lymphocytes Absolute: 1.9 x10E3/uL (ref 0.7–3.1)
Lymphs: 31 %
MCH: 26 pg — ABNORMAL LOW (ref 26.6–33.0)
MCHC: 31.9 g/dL (ref 31.5–35.7)
MCV: 81 fL (ref 79–97)
Monocytes Absolute: 0.3 x10E3/uL (ref 0.1–0.9)
Monocytes: 5 %
Neutrophils Absolute: 3.6 x10E3/uL (ref 1.4–7.0)
Neutrophils: 61 %
Platelets: 292 x10E3/uL (ref 150–450)
RBC: 5.31 x10E6/uL — ABNORMAL HIGH (ref 3.77–5.28)
RDW: 15.9 % — ABNORMAL HIGH (ref 11.7–15.4)
WBC: 5.9 x10E3/uL (ref 3.4–10.8)

## 2024-02-16 LAB — T3, FREE: T3, Free: 2.9 pg/mL (ref 2.0–4.4)

## 2024-02-16 LAB — LIPID PANEL
Chol/HDL Ratio: 4.2 ratio (ref 0.0–4.4)
Cholesterol, Total: 254 mg/dL — ABNORMAL HIGH (ref 100–199)
HDL: 60 mg/dL (ref >39)
LDL Chol Calc (NIH): 171 mg/dL — ABNORMAL HIGH (ref 0–99)
Triglycerides: 128 mg/dL (ref 0–149)
VLDL Cholesterol Cal: 23 mg/dL (ref 5–40)

## 2024-02-16 LAB — TSH+FREE T4
Free T4: 1.08 ng/dL (ref 0.82–1.77)
TSH: 1.72 u[IU]/mL (ref 0.450–4.500)

## 2024-02-16 LAB — VITAMIN D 25 HYDROXY (VIT D DEFICIENCY, FRACTURES): Vit D, 25-Hydroxy: 35.5 ng/mL (ref 30.0–100.0)

## 2024-02-16 MED ORDER — GABAPENTIN 300 MG PO CAPS: 300.0000 mg | ORAL_CAPSULE | Freq: Three times a day (TID) | ORAL | 3 refills | Status: AC

## 2024-02-21 NOTE — Progress Notes (Signed)
 Jill Webb,   Vitamin D  normal range but could be better. Increase vitamin D  daily by 1000 units.  Kidney function stable.  Liver enzymes normal  Cholesterol not to goal and worse than last year.  10 year risk is low at 2 percent. I would start a statin to help keep cholesterol to goal nd keep CV risk low.  SABRA.The 10-year ASCVD risk score (Arnett DK, et al., 2019) is: 2%   Values used to calculate the score:     Age: 58 years     Clincally relevant sex: Female     Is Non-Hispanic African American: Yes     Diabetic: No     Tobacco smoker: No     Systolic Blood Pressure: 107 mmHg     Is BP treated: No     HDL Cholesterol: 60 mg/dL     Total Cholesterol: 254 mg/dL   Thyroid  looks great.
